# Patient Record
Sex: Female | Born: 1960 | Race: White | Hispanic: No | Marital: Married | Smoking: Never smoker
Health system: Southern US, Community
[De-identification: ages and names within clinical notes are randomized; demographics above are authoritative.]

## PROBLEM LIST (undated history)

## (undated) DIAGNOSIS — R7303 Prediabetes: Secondary | ICD-10-CM

## (undated) DIAGNOSIS — M1990 Unspecified osteoarthritis, unspecified site: Secondary | ICD-10-CM

## (undated) DIAGNOSIS — I8390 Asymptomatic varicose veins of unspecified lower extremity: Secondary | ICD-10-CM

## (undated) DIAGNOSIS — T7840XA Allergy, unspecified, initial encounter: Secondary | ICD-10-CM

## (undated) DIAGNOSIS — A419 Sepsis, unspecified organism: Secondary | ICD-10-CM

## (undated) DIAGNOSIS — Z8489 Family history of other specified conditions: Secondary | ICD-10-CM

## (undated) HISTORY — DX: Prediabetes: R73.03

## (undated) HISTORY — DX: Unspecified osteoarthritis, unspecified site: M19.90

## (undated) HISTORY — PX: EXTERNAL EAR SURGERY: SHX627

## (undated) HISTORY — PX: BREAST CYST ASPIRATION: SHX578

## (undated) HISTORY — DX: Asymptomatic varicose veins of unspecified lower extremity: I83.90

## (undated) HISTORY — DX: Allergy, unspecified, initial encounter: T78.40XA

---

## 2006-09-08 ENCOUNTER — Other Ambulatory Visit: Payer: Self-pay

## 2006-09-08 ENCOUNTER — Emergency Department: Payer: Self-pay | Admitting: Emergency Medicine

## 2010-09-27 ENCOUNTER — Ambulatory Visit: Payer: Self-pay | Admitting: Internal Medicine

## 2013-11-20 ENCOUNTER — Ambulatory Visit: Payer: Self-pay | Admitting: Family Medicine

## 2014-09-23 ENCOUNTER — Emergency Department: Payer: Self-pay | Admitting: Emergency Medicine

## 2014-09-23 LAB — CBC WITH DIFFERENTIAL/PLATELET
BASOS PCT: 0.5 %
Basophil #: 0 10*3/uL (ref 0.0–0.1)
Eosinophil #: 0.1 10*3/uL (ref 0.0–0.7)
Eosinophil %: 1.1 %
HCT: 41.9 % (ref 35.0–47.0)
HGB: 13.2 g/dL (ref 12.0–16.0)
Lymphocyte #: 1.5 10*3/uL (ref 1.0–3.6)
Lymphocyte %: 18.5 %
MCH: 28.3 pg (ref 26.0–34.0)
MCHC: 31.5 g/dL — ABNORMAL LOW (ref 32.0–36.0)
MCV: 90 fL (ref 80–100)
Monocyte #: 0.6 x10 3/mm (ref 0.2–0.9)
Monocyte %: 7.4 %
Neutrophil #: 5.7 10*3/uL (ref 1.4–6.5)
Neutrophil %: 72.5 %
Platelet: 264 10*3/uL (ref 150–440)
RBC: 4.66 10*6/uL (ref 3.80–5.20)
RDW: 13.7 % (ref 11.5–14.5)
WBC: 7.9 10*3/uL (ref 3.6–11.0)

## 2014-09-23 LAB — BASIC METABOLIC PANEL
Anion Gap: 6 — ABNORMAL LOW (ref 7–16)
BUN: 11 mg/dL (ref 7–18)
CALCIUM: 8.7 mg/dL (ref 8.5–10.1)
CO2: 30 mmol/L (ref 21–32)
CREATININE: 0.72 mg/dL (ref 0.60–1.30)
Chloride: 106 mmol/L (ref 98–107)
EGFR (Non-African Amer.): >60
Glucose: 85 mg/dL (ref 65–99)
Osmolality: 282 (ref 275–301)
POTASSIUM: 3.9 mmol/L (ref 3.5–5.1)
Sodium: 142 mmol/L (ref 136–145)

## 2014-09-23 LAB — TROPONIN I: Troponin-I: <0.02 ng/mL

## 2014-12-29 ENCOUNTER — Ambulatory Visit: Payer: Self-pay | Admitting: Family Medicine

## 2015-01-19 ENCOUNTER — Ambulatory Visit: Payer: Self-pay | Admitting: Gastroenterology

## 2015-11-30 ENCOUNTER — Telehealth: Payer: Self-pay | Admitting: Family Medicine

## 2015-11-30 NOTE — Telephone Encounter (Signed)
Patient has annual physical scheduled for next week and she would like to know if you would print her a lab order out (the general blood work). She would like to do blood work in the morning since her appointment is for the afternoon.

## 2015-12-01 ENCOUNTER — Other Ambulatory Visit: Payer: Self-pay | Admitting: Family Medicine

## 2015-12-01 DIAGNOSIS — I8390 Asymptomatic varicose veins of unspecified lower extremity: Secondary | ICD-10-CM | POA: Insufficient documentation

## 2015-12-01 DIAGNOSIS — J309 Allergic rhinitis, unspecified: Secondary | ICD-10-CM

## 2015-12-01 DIAGNOSIS — Z0000 Encounter for general adult medical examination without abnormal findings: Secondary | ICD-10-CM

## 2015-12-01 DIAGNOSIS — M129 Arthropathy, unspecified: Secondary | ICD-10-CM | POA: Insufficient documentation

## 2015-12-01 DIAGNOSIS — R7303 Prediabetes: Secondary | ICD-10-CM | POA: Insufficient documentation

## 2015-12-01 NOTE — Telephone Encounter (Signed)
Lab work ready to pick up and have drawn fasting.

## 2015-12-01 NOTE — Telephone Encounter (Signed)
Patient informed and will try to pick it up today. Thank you

## 2015-12-03 LAB — COMPREHENSIVE METABOLIC PANEL
ALBUMIN: 3.9 g/dL (ref 3.5–5.5)
ALK PHOS: 83 IU/L (ref 39–117)
ALT: 18 IU/L (ref 0–32)
AST: 25 IU/L (ref 0–40)
Albumin/Globulin Ratio: 1.4 (ref 1.1–2.5)
BUN / CREAT RATIO: 24 — AB (ref 9–23)
BUN: 18 mg/dL (ref 6–24)
Bilirubin Total: 0.5 mg/dL (ref 0.0–1.2)
CALCIUM: 9 mg/dL (ref 8.7–10.2)
CO2: 26 mmol/L (ref 18–29)
CREATININE: 0.76 mg/dL (ref 0.57–1.00)
Chloride: 100 mmol/L (ref 96–106)
GFR calc Af Amer: 104 mL/min/{1.73_m2} (ref >59)
GFR, EST NON AFRICAN AMERICAN: 90 mL/min/{1.73_m2} (ref >59)
GLOBULIN, TOTAL: 2.7 g/dL (ref 1.5–4.5)
GLUCOSE: 86 mg/dL (ref 65–99)
Potassium: 4.4 mmol/L (ref 3.5–5.2)
Sodium: 140 mmol/L (ref 134–144)
Total Protein: 6.6 g/dL (ref 6.0–8.5)

## 2015-12-03 LAB — CBC WITH DIFFERENTIAL/PLATELET
BASOS: 0 %
Basophils Absolute: 0 10*3/uL (ref 0.0–0.2)
EOS (ABSOLUTE): 0.2 10*3/uL (ref 0.0–0.4)
EOS: 3 %
HEMATOCRIT: 38.4 % (ref 34.0–46.6)
Hemoglobin: 12.5 g/dL (ref 11.1–15.9)
IMMATURE GRANULOCYTES: 0 %
Immature Grans (Abs): 0 10*3/uL (ref 0.0–0.1)
Lymphocytes Absolute: 2 10*3/uL (ref 0.7–3.1)
Lymphs: 29 %
MCH: 28.4 pg (ref 26.6–33.0)
MCHC: 32.6 g/dL (ref 31.5–35.7)
MCV: 87 fL (ref 79–97)
Monocytes Absolute: 0.7 10*3/uL (ref 0.1–0.9)
Monocytes: 11 %
NEUTROS PCT: 57 %
Neutrophils Absolute: 3.8 10*3/uL (ref 1.4–7.0)
Platelets: 283 10*3/uL (ref 150–379)
RBC: 4.4 x10E6/uL (ref 3.77–5.28)
RDW: 13.9 % (ref 12.3–15.4)
WBC: 6.7 10*3/uL (ref 3.4–10.8)

## 2015-12-03 LAB — LIPID PANEL
CHOL/HDL RATIO: 2 ratio (ref 0.0–4.4)
CHOLESTEROL TOTAL: 174 mg/dL (ref 100–199)
HDL: 89 mg/dL (ref >39)
LDL CALC: 73 mg/dL (ref 0–99)
TRIGLYCERIDES: 61 mg/dL (ref 0–149)
VLDL CHOLESTEROL CAL: 12 mg/dL (ref 5–40)

## 2015-12-03 LAB — HEMOGLOBIN A1C
Est. average glucose Bld gHb Est-mCnc: 123 mg/dL
Hgb A1c MFr Bld: 5.9 % — ABNORMAL HIGH (ref 4.8–5.6)

## 2015-12-03 LAB — TSH: TSH: 1.87 u[IU]/mL (ref 0.450–4.500)

## 2015-12-09 ENCOUNTER — Encounter: Payer: Self-pay | Admitting: Family Medicine

## 2015-12-09 ENCOUNTER — Ambulatory Visit (INDEPENDENT_AMBULATORY_CARE_PROVIDER_SITE_OTHER): Payer: Managed Care, Other (non HMO) | Admitting: Family Medicine

## 2015-12-09 VITALS — BP 112/68 | HR 72 | Temp 97.7°F | Resp 12 | Ht 60.0 in | Wt 124.0 lb

## 2015-12-09 DIAGNOSIS — Z0000 Encounter for general adult medical examination without abnormal findings: Secondary | ICD-10-CM | POA: Diagnosis not present

## 2015-12-09 DIAGNOSIS — Z1231 Encounter for screening mammogram for malignant neoplasm of breast: Secondary | ICD-10-CM | POA: Diagnosis not present

## 2015-12-09 DIAGNOSIS — B070 Plantar wart: Secondary | ICD-10-CM | POA: Diagnosis not present

## 2015-12-09 DIAGNOSIS — L989 Disorder of the skin and subcutaneous tissue, unspecified: Secondary | ICD-10-CM | POA: Diagnosis not present

## 2015-12-09 DIAGNOSIS — Z23 Encounter for immunization: Secondary | ICD-10-CM | POA: Insufficient documentation

## 2015-12-09 DIAGNOSIS — R7303 Prediabetes: Secondary | ICD-10-CM | POA: Diagnosis not present

## 2015-12-09 NOTE — Progress Notes (Signed)
Name: Jacqueline Rush   MRN: [id number]    DOB: [birth year]   Date:12/09/2015       Progress Note  Subjective  Chief Complaint  Chief Complaint  Patient presents with  . Annual Exam    HPI  Patient is here today for a Complete Female Physical Exam:  The patient has no unusual complaints but does note plantar warts and a irritative lesion on left anterior hip. Bleeds and irritates her under ware line. Overall feels healthy. Diet is well balanced. In general does exercise regularly. Sees dentist regularly and addresses vision concerns with ophthalmologist if applicable. In regards to sexual activity the patient is currently sexually active. Currently is not concerned about exposure to any STDs.    Past Medical History  Diagnosis Date  . Allergy   . Pre-diabetes   . Arthritis   . Varicose veins     Past Surgical History  Procedure Laterality Date  . Cesarean section      Family History  Problem Relation Age of Onset  . Cancer Father   . Diabetes Father   . Colon cancer Father   . Prostate cancer Father   . Ulcerative colitis Daughter   . Hypertension Mother   . Heart disease Mother     Social History   Social History  . Marital Status: Married    Spouse Name: N/A  . Number of Children: N/A  . Years of Education: N/A   Occupational History  . Not on file.   Social History Main Topics  . Smoking status: Never Smoker   . Smokeless tobacco: Not on file  . Alcohol Use: No  . Drug Use: No  . Sexual Activity:    Partners: Male   Other Topics Concern  . Not on file   Social History Narrative  . No narrative on file    No current outpatient prescriptions on file.  Allergies  Allergen Reactions  . Contrast Media [Iodinated Diagnostic Agents] Swelling  . Codeine Other (See Comments)    ROS  CONSTITUTIONAL: No significant weight changes, fever, chills, weakness or fatigue.  HEENT:  - Eyes: No visual changes.  - Ears: No auditory changes. No pain.  -  Nose: No sneezing, congestion, runny nose. - Throat: No sore throat. No changes in swallowing. SKIN: No rash or itching.  CARDIOVASCULAR: No chest pain, chest pressure or chest discomfort. No palpitations or edema.  RESPIRATORY: No shortness of breath, cough or sputum.  GASTROINTESTINAL: No anorexia, nausea, vomiting. No changes in bowel habits. No abdominal pain or blood.  GENITOURINARY: No dysuria. No frequency. No discharge.  NEUROLOGICAL: No headache, dizziness, syncope, paralysis, ataxia, numbness or tingling in the extremities. No memory changes. No change in bowel or bladder control.  MUSCULOSKELETAL: No joint pain. No muscle pain. HEMATOLOGIC: No anemia, bleeding or bruising.  LYMPHATICS: No enlarged lymph nodes.  PSYCHIATRIC: No change in mood. No change in sleep pattern.  ENDOCRINOLOGIC: No reports of sweating, cold or heat intolerance. No polyuria or polydipsia.   Objective  Filed Vitals:   12/09/15 1414  BP: 112/68  Pulse: 72  Temp: 97.7 F (36.5 C)  TempSrc: Oral  Resp: 12  Height: 5' (1.524 m)  Weight: 124 lb (56.246 kg)  SpO2: 95%   Body mass index is 24.22 kg/(m^2).    Recent Results (from the past 2160 hour(s))  CBC with Differential/Platelet     Status: None   Collection Time: 12/02/15  8:33 AM  Result Value Ref Range  WBC 6.7 3.4 - 10.8 x10E3/uL   RBC 4.40 3.77 - 5.28 x10E6/uL   Hemoglobin 12.5 11.1 - 15.9 g/dL   Hematocrit 38.4 34.0 - 46.6 %   MCV 87 79 - 97 fL   MCH 28.4 26.6 - 33.0 pg   MCHC 32.6 31.5 - 35.7 g/dL   RDW 13.9 12.3 - 15.4 %   Platelets 283 150 - 379 x10E3/uL   Neutrophils 57 %   Lymphs 29 %   Monocytes 11 %   Eos 3 %   Basos 0 %   Neutrophils Absolute 3.8 1.4 - 7.0 x10E3/uL   Lymphocytes Absolute 2.0 0.7 - 3.1 x10E3/uL   Monocytes Absolute 0.7 0.1 - 0.9 x10E3/uL   EOS (ABSOLUTE) 0.2 0.0 - 0.4 x10E3/uL   Basophils Absolute 0.0 0.0 - 0.2 x10E3/uL   Immature Granulocytes 0 %   Immature Grans (Abs) 0.0 0.0 - 0.1 x10E3/uL   Comprehensive metabolic panel     Status: Abnormal   Collection Time: 12/02/15  8:33 AM  Result Value Ref Range   Glucose 86 65 - 99 mg/dL   BUN 18 6 - 24 mg/dL   Creatinine, Ser 0.76 0.57 - 1.00 mg/dL   GFR calc non Af Amer 90 >59 mL/min/1.73   GFR calc Af Amer 104 >59 mL/min/1.73   BUN/Creatinine Ratio 24 (H) 9 - 23   Sodium 140 134 - 144 mmol/L    Comment:               **Please note reference interval change**   Potassium 4.4 3.5 - 5.2 mmol/L   Chloride 100 96 - 106 mmol/L    Comment:               **Please note reference interval change**   CO2 26 18 - 29 mmol/L   Calcium 9.0 8.7 - 10.2 mg/dL   Total Protein 6.6 6.0 - 8.5 g/dL   Albumin 3.9 3.5 - 5.5 g/dL   Globulin, Total 2.7 1.5 - 4.5 g/dL   Albumin/Globulin Ratio 1.4 1.1 - 2.5   Bilirubin Total 0.5 0.0 - 1.2 mg/dL   Alkaline Phosphatase 83 39 - 117 IU/L   AST 25 0 - 40 IU/L   ALT 18 0 - 32 IU/L  Lipid panel     Status: None   Collection Time: 12/02/15  8:33 AM  Result Value Ref Range   Cholesterol, Total 174 100 - 199 mg/dL   Triglycerides 61 0 - 149 mg/dL   HDL 89 >39 mg/dL   VLDL Cholesterol Cal 12 5 - 40 mg/dL   LDL Calculated 73 0 - 99 mg/dL   Chol/HDL Ratio 2.0 0.0 - 4.4 ratio units    Comment:                                   T. Chol/HDL Ratio                                             Men  Women                               1/2 Avg.Risk  3.4    3.3  Avg.Risk  5.0    4.4                                2X Avg.Risk  9.6    7.1                                3X Avg.Risk 23.4   11.0   TSH     Status: None   Collection Time: 12/02/15  8:33 AM  Result Value Ref Range   TSH 1.870 0.450 - 4.500 uIU/mL  Hemoglobin A1c     Status: Abnormal   Collection Time: 12/02/15  8:33 AM  Result Value Ref Range   Hgb A1c MFr Bld 5.9 (H) 4.8 - 5.6 %    Comment:          Pre-diabetes: 5.7 - 6.4          Diabetes: >6.4          Glycemic control for adults with diabetes: <7.0    Est.  average glucose Bld gHb Est-mCnc 123 mg/dL    Physical Exam  Constitutional: Patient appears well-developed and well-nourished. In no distress.  HEENT:  - Head: Normocephalic and atraumatic.  - Ears: Bilateral TMs gray, no erythema or effusion - Nose: Nasal mucosa moist - Mouth/Throat: Oropharynx is clear and moist. No tonsillar hypertrophy or erythema. No post nasal drainage.  - Eyes: Conjunctivae clear, EOM movements normal. PERRLA. No scleral icterus.  Neck: Normal range of motion. Neck supple. No JVD present. No thyromegaly present.  Cardiovascular: Normal rate, regular rhythm and normal heart sounds.  No murmur heard.  Pulmonary/Chest: Effort normal and breath sounds normal. No respiratory distress. Abdominal: Soft. Bowel sounds are normal, no distension. There is no tenderness. no masses BREAST: Bilateral breast exam normal with no masses, skin changes or nipple discharge FEMALE GENITALIA: Deferred Musculoskeletal: Normal range of motion bilateral UE and LE, no joint effusions. Peripheral vascular: Bilateral LE no edema. Spider veins and varicose veins blt.  Neurological: CN II-XII grossly intact with no focal deficits. Alert and oriented to person, place, and time. Coordination, balance, strength, speech and gait are normal.  Skin: Skin is warm and dry. No rash noted. No erythema. 2 CM pedunculated skin lesion left anterior hip area. Scattered nevi.  Psychiatric: Patient has a normal mood and affect. Behavior is normal in office today. Judgment and thought content normal in office today.   Assessment & Plan  1. Annual physical exam Discussed in detail all recommended preventative measures appropriate for age and gender now and in the future.   2. Encounter for screening mammogram for malignant neoplasm of breast  - MM Digital Screening; Future  3. Prediabetes Reviewed all lab work.  4. Skin lesion Derm for removal  5. Plantar warts Monitor  6. Need for immunization  against influenza  - Flu vaccine > 3yo with preservative IM (Fluvirin Influenza Split)

## 2016-01-04 ENCOUNTER — Ambulatory Visit
Admission: RE | Admit: 2016-01-04 | Discharge: 2016-01-04 | Disposition: A | Discharge status: Home / Self Care | Payer: Managed Care, Other (non HMO) | Source: Ambulatory Visit | Attending: Family Medicine | Admitting: Family Medicine

## 2016-01-04 DIAGNOSIS — Z1231 Encounter for screening mammogram for malignant neoplasm of breast: Secondary | ICD-10-CM

## 2016-03-17 ENCOUNTER — Emergency Department
Admission: EM | Admit: 2016-03-17 | Discharge: 2016-03-17 | Disposition: A | Discharge status: Home / Self Care | Payer: Managed Care, Other (non HMO) | Attending: Emergency Medicine | Admitting: Emergency Medicine

## 2016-03-17 DIAGNOSIS — Y929 Unspecified place or not applicable: Secondary | ICD-10-CM | POA: Diagnosis not present

## 2016-03-17 DIAGNOSIS — Z86718 Personal history of other venous thrombosis and embolism: Secondary | ICD-10-CM | POA: Diagnosis not present

## 2016-03-17 DIAGNOSIS — M1990 Unspecified osteoarthritis, unspecified site: Secondary | ICD-10-CM | POA: Insufficient documentation

## 2016-03-17 DIAGNOSIS — Y999 Unspecified external cause status: Secondary | ICD-10-CM | POA: Insufficient documentation

## 2016-03-17 DIAGNOSIS — Y9389 Activity, other specified: Secondary | ICD-10-CM | POA: Insufficient documentation

## 2016-03-17 DIAGNOSIS — W228XXA Striking against or struck by other objects, initial encounter: Secondary | ICD-10-CM | POA: Diagnosis not present

## 2016-03-17 DIAGNOSIS — S81811A Laceration without foreign body, right lower leg, initial encounter: Secondary | ICD-10-CM

## 2016-03-17 MED ORDER — LIDOCAINE-EPINEPHRINE (PF) 1 %-1:200000 IJ SOLN
10.0000 mL | Freq: Once | INTRAMUSCULAR | Status: AC
  Administered 2016-03-17: 10 mL
  Filled 2016-03-17: qty 30

## 2016-03-17 NOTE — Discharge Instructions (Signed)
Laceration Care, Adult °A laceration is a cut that goes through all of the layers of the skin and into the tissue that is right under the skin. Some lacerations heal on their own. Others need to be closed with stitches (sutures), staples, skin adhesive strips, or skin glue. Proper laceration care minimizes the risk of infection and helps the laceration to heal better. °HOW TO CARE FOR YOUR LACERATION °If sutures or staples were used: °· Keep the wound clean and dry. °· If you were given a bandage (dressing), you should change it at least one time per day or as told by your health care provider. You should also change it if it becomes wet or dirty. °· Keep the wound completely dry for the first 24 hours or as told by your health care provider. After that time, you may shower or bathe. However, make sure that the wound is not soaked in water until after the sutures or staples have been removed. °· Clean the wound one time each day or as told by your health care provider: °· Wash the wound with soap and water. °· Rinse the wound with water to remove all soap. °· Pat the wound dry with a clean towel. Do not rub the wound. °· After cleaning the wound, apply a thin layer of antibiotic ointment as told by your health care provider. This will help to prevent infection and keep the dressing from sticking to the wound. °· Have the sutures or staples removed as told by your health care provider. °If skin adhesive strips were used: °· Keep the wound clean and dry. °· If you were given a bandage (dressing), you should change it at least one time per day or as told by your health care provider. You should also change it if it becomes dirty or wet. °· Do not get the skin adhesive strips wet. You may shower or bathe, but be careful to keep the wound dry. °· If the wound gets wet, pat it dry with a clean towel. Do not rub the wound. °· Skin adhesive strips fall off on their own. You may trim the strips as the wound heals. Do not  remove skin adhesive strips that are still stuck to the wound. They will fall off in time. °If skin glue was used: °· Try to keep the wound dry, but you may briefly wet it in the shower or bath. Do not soak the wound in water, such as by swimming. °· After you have showered or bathed, gently pat the wound dry with a clean towel. Do not rub the wound. °· Do not do any activities that will make you sweat heavily until the skin glue has fallen off on its own. °· Do not apply liquid, cream, or ointment medicine to the wound while the skin glue is in place. Using those may loosen the film before the wound has healed. °· If you were given a bandage (dressing), you should change it at least one time per day or as told by your health care provider. You should also change it if it becomes dirty or wet. °· If a dressing is placed over the wound, be careful not to apply tape directly over the skin glue. Doing that may cause the glue to be pulled off before the wound has healed. °· Do not pick at the glue. The skin glue usually remains in place for 5-10 days, then it falls off of the skin. °General Instructions °· Take over-the-counter and prescription   medicines only as told by your health care provider. °· If you were prescribed an antibiotic medicine or ointment, take or apply it as told by your doctor. Do not stop using it even if your condition improves. °· To help prevent scarring, make sure to cover your wound with sunscreen whenever you are outside after stitches are removed, after adhesive strips are removed, or when glue remains in place and the wound is healed. Make sure to wear a sunscreen of at least 30 SPF. °· Do not scratch or pick at the wound. °· Keep all follow-up visits as told by your health care provider. This is important. °· Check your wound every day for signs of infection. Watch for: °· Redness, swelling, or pain. °· Fluid, blood, or pus. °· Raise (elevate) the injured area above the level of your heart  while you are sitting or lying down, if possible. °SEEK MEDICAL CARE IF: °· You received a tetanus shot and you have swelling, severe pain, redness, or bleeding at the injection site. °· You have a fever. °· A wound that was closed breaks open. °· You notice a bad smell coming from your wound or your dressing. °· You notice something coming out of the wound, such as wood or glass. °· Your pain is not controlled with medicine. °· You have increased redness, swelling, or pain at the site of your wound. °· You have fluid, blood, or pus coming from your wound. °· You notice a change in the color of your skin near your wound. °· You need to change the dressing frequently due to fluid, blood, or pus draining from the wound. °· You develop a new rash. °· You develop numbness around the wound. °SEEK IMMEDIATE MEDICAL CARE IF: °· You develop severe swelling around the wound. °· Your pain suddenly increases and is severe. °· You develop painful lumps near the wound or on skin that is anywhere on your body. °· You have a red streak going away from your wound. °· The wound is on your hand or foot and you cannot properly move a finger or toe. °· The wound is on your hand or foot and you notice that your fingers or toes look pale or bluish. °  °This information is not intended to replace advice given to you by your health care provider. Make sure you discuss any questions you have with your health care provider. °  °Document Released: 11/28/2005 Document Revised: 04/14/2015 Document Reviewed: 11/24/2014 °[employer]vier Interactive Patient Education ©2016 [employer] ° °Stitches, Staples, or Adhesive Wound Closure °Health care providers use stitches (sutures), staples, and certain glue (skin adhesives) to hold skin together while it heals (wound closure). You may need this treatment after you have surgery or if you cut your skin accidentally. These methods help your skin to heal more quickly and make it less likely that you will have  a scar. A wound may take several months to heal completely. °The type of wound you have determines when your wound gets closed. In most cases, the wound is closed as soon as possible (primary skin closure). Sometimes, closure is delayed so the wound can be cleaned and allowed to heal naturally. This reduces the chance of infection. Delayed closure may be needed if your wound: °· Is caused by a bite. °· Happened more than 6 hours ago. °· Involves loss of skin or the tissues under the skin. °· Has dirt or debris in it that cannot be removed. °· Is infected. °WHAT   ARE THE DIFFERENT KINDS OF WOUND CLOSURES? °There are many options for wound closure. The one that your health care provider uses depends on how deep and how large your wound is. °Adhesive Glue °To use this type of glue to close a wound, your health care provider holds the edges of the wound together and paints the glue on the surface of your skin. You may need more than one layer of glue. Then the wound may be covered with a light bandage (dressing). °This type of skin closure may be used for small wounds that are not deep (superficial). Using glue for wound closure is less painful than other methods. It does not require a medicine that numbs the area (local anesthetic). This method also leaves nothing to be removed. Adhesive glue is often used for children and on facial wounds. °Adhesive glue cannot be used for wounds that are deep, uneven, or bleeding. It is not used inside of a wound.  °Adhesive Strips °These strips are made of sticky (adhesive), porous paper. They are applied across your skin edges like a regular adhesive bandage. You leave them on until they fall off. °Adhesive strips may be used to close very superficial wounds. They may also be used along with sutures to improve the closure of your skin edges.  °Sutures °Sutures are the oldest method of wound closure. Sutures can be made from natural substances, such as silk, or from synthetic  materials, such as nylon and steel. They can be made from a material that your body can break down as your wound heals (absorbable), or they can be made from a material that needs to be removed from your skin (nonabsorbable). They come in many different strengths and sizes. °Your health care provider attaches the sutures to a steel needle on one end. Sutures can be passed through your skin, or through the tissues beneath your skin. Then they are tied and cut. Your skin edges may be closed in one continuous stitch or in separate stitches. °Sutures are strong and can be used for all kinds of wounds. Absorbable sutures may be used to close tissues under the skin. The disadvantage of sutures is that they may cause skin reactions that lead to infection. Nonabsorbable sutures need to be removed. °Staples °When surgical staples are used to close a wound, the edges of your skin on both sides of the wound are brought close together. A staple is placed across the wound, and an instrument secures the edges together. Staples are often used to close surgical cuts (incisions). °Staples are faster to use than sutures, and they cause less skin reaction. Staples need to be removed using a tool that bends the staples away from your skin. °HOW DO I CARE FOR MY WOUND CLOSURE? °· Take medicines only as directed by your health care provider. °· If you were prescribed an antibiotic medicine for your wound, finish it all even if you start to feel better. °· Use ointments or creams only as directed by your health care provider. °· Wash your hands with soap and water before and after touching your wound. °· Do not soak your wound in water. Do not take baths, swim, or use a hot tub until your health care provider approves. °· Ask your health care provider when you can start showering. Cover your wound if directed by your health care provider. °· Do not take out your own sutures or staples. °· Do not pick at your wound. Picking can cause an  infection. °·   Keep all follow-up visits as directed by your health care provider. This is important. °HOW LONG WILL I HAVE MY WOUND CLOSURE? °· Leave adhesive glue on your skin until the glue peels away. °· Leave adhesive strips on your skin until the strips fall off. °· Absorbable sutures will dissolve within several days. °· Nonabsorbable sutures and staples must be removed. The location of the wound will determine how long they stay in. This can range from several days to a couple of weeks. °WHEN SHOULD I SEEK HELP FOR MY WOUND CLOSURE? °Contact your health care provider if: °· You have a fever. °· You have chills. °· You have drainage, redness, swelling, or pain at your wound. °· There is a bad smell coming from your wound. °· The skin edges of your wound start to separate after your sutures have been removed. °· Your wound becomes thick, raised, and darker in color after your sutures come out (scarring). °  °This information is not intended to replace advice given to you by your health care provider. Make sure you discuss any questions you have with your health care provider. °  °Document Released: 08/23/2001 Document Revised: 12/19/2014 Document Reviewed: 05/07/2014 °Elsevier Interactive Patient Education ©2016 [employer] ° °

## 2016-03-17 NOTE — ED Provider Notes (Signed)
[facility] Emergency Department Provider Note  ____________________________________________  Time seen: Approximately 8:16 PM  I have reviewed the triage vital signs and the nursing notes.   HISTORY  Chief Complaint Laceration    HPI Jacqueline Rush is a 55 y.o. female who presents emergency department complaining of laceration to her right lower leg. Patient was using box jumps when she missed the box hitting her shin. Patient is endorsing a laceration to the anterior aspect of her shin. Bleeding is controlled prior to arrival. Patient is up-to-date on tetanus. Pain is minimal at this time.   Past Medical History  Diagnosis Date  . Allergy   . Pre-diabetes   . Arthritis   . Varicose veins     Patient Active Problem List   Diagnosis Date Noted  . Annual physical exam 12/09/2015  . Encounter for screening mammogram for malignant neoplasm of breast 12/09/2015  . Skin lesion 12/09/2015  . Plantar warts 12/09/2015  . Need for immunization against influenza 12/09/2015  . Arthritis, multiple joint involvement 12/01/2015  . Varicose vein 12/01/2015  . Allergic rhinitis 12/01/2015  . Prediabetes 12/01/2015    Past Surgical History  Procedure Laterality Date  . Cesarean section    . Breast cyst aspiration Left     No current outpatient prescriptions on file.  Allergies Contrast media and Codeine  Family History  Problem Relation Age of Onset  . Cancer Father   . Diabetes Father   . Colon cancer Father   . Prostate cancer Father   . Ulcerative colitis Daughter   . Hypertension Mother   . Heart disease Mother     Social History Social History  Substance Use Topics  . Smoking status: Never Smoker   . Smokeless tobacco: Not on file  . Alcohol Use: No     Review of Systems  Constitutional: No fever/chills Cardiovascular: no chest pain. Respiratory: no cough. No SOB. Musculoskeletal: Negative for back pain. Skin: Negative for  rash.Positive for laceration to her right lower leg Neurological: Negative for headaches, focal weakness or numbness. 10-point ROS otherwise negative.  ____________________________________________   PHYSICAL EXAM:  VITAL SIGNS: ED Triage Vitals  Enc Vitals Group     BP 03/17/16 1947 160/84 mmHg     Pulse Rate 03/17/16 1947 73     Resp 03/17/16 1947 18     Temp 03/17/16 1947 98.1 F (36.7 C)     Temp Source 03/17/16 1947 Oral     SpO2 03/17/16 1947 98 %     Weight 03/17/16 1947 117 lb (53.071 kg)     Height 03/17/16 1947 5' (1.524 m)     Head Cir --      Peak Flow --      Pain Score --      Pain Loc --      Pain Edu? --      Excl. in [place]? --      Constitutional: Alert and oriented. Well appearing and in no acute distress. Cardiovascular: Normal rate, regular rhythm. Normal S1 and S2.  Good peripheral circulation. Respiratory: Normal respiratory effort without tachypnea or retractions. Lungs CTAB. Musculoskeletal: Full range of motion to right lower extremity. Exam of the knee and ankle are unremarkable. Neurologic:  Normal speech and language. No gross focal neurologic deficits are appreciated.  Skin:  Skin is warm, dry and intact. No rash noted. Laceration noted to right anterior lower leg. Laceration is deep with exposed subcutaneous and muscle tissue. Laceration has not  cut muscle tissue. Laceration is approximately 7 cm in length. Edges are ragged. Small foreign body is noted on the lateral aspect of the wound. Bleeding is controlled. Psychiatric: Mood and affect are normal. Speech and behavior are normal. Patient exhibits appropriate insight and judgement.   ____________________________________________   LABS (all labs ordered are listed, but only abnormal results are displayed)  Labs Reviewed - No data to display ____________________________________________  EKG   ____________________________________________  RADIOLOGY   No results  found.  ____________________________________________    PROCEDURES  Procedure(s) performed:   LACERATION REPAIR Performed by: Darletta Moll Authorized by: Charline Bills Cuthriell Consent: Verbal consent obtained. Risks and benefits: risks, benefits and alternatives were discussed Consent given by: patient Patient identity confirmed: provided demographic data Prepped and Draped in normal sterile fashion Wound explored  Laceration Location: Right anterior lower leg  Laceration Length: 7 cm  Foreign body is visualized on the lateral aspect of the wound. This is consistent with a small chip of wood.  Anesthesia: local infiltration  Local anesthetic: lidocaine 1 % with epinephrine  Anesthetic total: 10 ml  Irrigation method: syringe Amount of cleaning: Extensive   Skin closure: 3-0 Ethilon sutures and 5-0 Vicryl sutures   Number of sutures: 3 subcutaneous sutures are placed using 5-0 Vicryl suture material. 8 sutures are placed using 3-0 Ethilon sutures to close the exterior of the wound.   Technique: 3 subcutaneous sutures were placed using 5-0 Vicryl. 8 sutures are placed using 3-0 Ethilon suture material. Edges are well approximated. Previous to this wound was thoroughly irrigated and visible foreign body was removed.   Patient tolerance: Patient tolerated the procedure well with no immediate complications.     Medications  lidocaine-EPINEPHrine (XYLOCAINE-EPINEPHrine) 1 %-1:200000 (PF) injection 10 mL (10 mLs Infiltration Given 03/17/16 2029)     ____________________________________________   INITIAL IMPRESSION / ASSESSMENT AND PLAN / ED COURSE  Pertinent labs & imaging results that were available during my care of the patient were reviewed by me and considered in my medical decision making (see chart for details).  Patient's diagnosis is consistent with a laceration to the right leg. This is close described above. Patient was current on her tetanus and no  tetanus booster is provided today.. Patient and take Tylenol and/or Motrin at home for symptom relief. Patient is to follow up with primary care provider in one week for suture removal or sooner if needed. Patient is given ED precautions to return to the ED for any worsening or new symptoms.     ____________________________________________  FINAL CLINICAL IMPRESSION(S) / ED DIAGNOSES  Final diagnoses:  Leg laceration, right, initial encounter      NEW MEDICATIONS STARTED DURING THIS VISIT:  New Prescriptions   No medications on file        This chart was dictated using voice recognition software/Dragon. Despite best efforts to proofread, errors can occur which can change the meaning. Any change was purely unintentional.    Darletta Moll, PA-C 03/17/16 [place], MD 03/17/16 [phone]

## 2016-03-17 NOTE — ED Notes (Signed)
Pt in with lac to right shin was doing box jumps and missed the box hitting shin on the box.

## 2016-04-21 ENCOUNTER — Other Ambulatory Visit: Payer: Self-pay | Admitting: Student

## 2016-04-21 DIAGNOSIS — M7582 Other shoulder lesions, left shoulder: Secondary | ICD-10-CM

## 2016-05-11 ENCOUNTER — Ambulatory Visit
Admission: RE | Admit: 2016-05-11 | Discharge: 2016-05-11 | Disposition: A | Discharge status: Home / Self Care | Payer: Managed Care, Other (non HMO) | Source: Ambulatory Visit | Attending: Student | Admitting: Student

## 2016-05-11 DIAGNOSIS — S43402A Unspecified sprain of left shoulder joint, initial encounter: Secondary | ICD-10-CM | POA: Diagnosis not present

## 2016-05-11 DIAGNOSIS — M25712 Osteophyte, left shoulder: Secondary | ICD-10-CM | POA: Diagnosis not present

## 2016-05-11 DIAGNOSIS — M7582 Other shoulder lesions, left shoulder: Secondary | ICD-10-CM

## 2016-05-11 MED ORDER — IOPAMIDOL (ISOVUE-300) INJECTION 61%: 5.0000 mL | Freq: Once | INTRAVENOUS | Status: DC | PRN

## 2016-05-11 MED ORDER — GADOBENATE DIMEGLUMINE 529 MG/ML IV SOLN: 0.1000 mL | Freq: Once | INTRAVENOUS | Status: DC | PRN

## 2016-05-11 MED ORDER — LIDOCAINE HCL (PF) 1 % IJ SOLN
5.0000 mL | Freq: Once | INTRAMUSCULAR | Status: DC
  Filled 2016-05-11: qty 5

## 2016-05-16 DIAGNOSIS — M7582 Other shoulder lesions, left shoulder: Secondary | ICD-10-CM | POA: Insufficient documentation

## 2016-05-16 DIAGNOSIS — S83231A Complex tear of medial meniscus, current injury, right knee, initial encounter: Secondary | ICD-10-CM | POA: Insufficient documentation

## 2016-05-16 DIAGNOSIS — S43432A Superior glenoid labrum lesion of left shoulder, initial encounter: Secondary | ICD-10-CM | POA: Insufficient documentation

## 2016-05-18 ENCOUNTER — Other Ambulatory Visit: Payer: Self-pay | Admitting: Surgery

## 2016-05-18 DIAGNOSIS — S83231A Complex tear of medial meniscus, current injury, right knee, initial encounter: Secondary | ICD-10-CM

## 2016-05-23 ENCOUNTER — Ambulatory Visit
Admission: RE | Admit: 2016-05-23 | Discharge: 2016-05-23 | Disposition: A | Discharge status: Home / Self Care | Payer: Managed Care, Other (non HMO) | Source: Ambulatory Visit | Attending: Surgery | Admitting: Surgery

## 2016-05-23 DIAGNOSIS — S83231A Complex tear of medial meniscus, current injury, right knee, initial encounter: Secondary | ICD-10-CM | POA: Diagnosis not present

## 2016-05-23 DIAGNOSIS — M25461 Effusion, right knee: Secondary | ICD-10-CM | POA: Insufficient documentation

## 2016-05-23 DIAGNOSIS — S83411A Sprain of medial collateral ligament of right knee, initial encounter: Secondary | ICD-10-CM | POA: Diagnosis not present

## 2016-05-23 DIAGNOSIS — M948X6 Other specified disorders of cartilage, lower leg: Secondary | ICD-10-CM | POA: Insufficient documentation

## 2016-05-27 DIAGNOSIS — M1711 Unilateral primary osteoarthritis, right knee: Secondary | ICD-10-CM | POA: Insufficient documentation

## 2016-05-30 ENCOUNTER — Encounter: Payer: Self-pay | Admitting: *Deleted

## 2016-06-01 ENCOUNTER — Encounter
Admission: RE | Disposition: A | Discharge status: Home / Self Care | Payer: Self-pay | Source: Ambulatory Visit | Attending: Surgery

## 2016-06-01 ENCOUNTER — Ambulatory Visit
Admission: RE | Admit: 2016-06-01 | Discharge: 2016-06-01 | Disposition: A | Discharge status: Home / Self Care | Payer: Managed Care, Other (non HMO) | Source: Ambulatory Visit | Attending: Surgery | Admitting: Surgery

## 2016-06-01 ENCOUNTER — Ambulatory Visit: Payer: Managed Care, Other (non HMO) | Admitting: Anesthesiology

## 2016-06-01 DIAGNOSIS — M1711 Unilateral primary osteoarthritis, right knee: Secondary | ICD-10-CM | POA: Insufficient documentation

## 2016-06-01 DIAGNOSIS — M1990 Unspecified osteoarthritis, unspecified site: Secondary | ICD-10-CM | POA: Insufficient documentation

## 2016-06-01 DIAGNOSIS — M6751 Plica syndrome, right knee: Secondary | ICD-10-CM | POA: Insufficient documentation

## 2016-06-01 DIAGNOSIS — S83231A Complex tear of medial meniscus, current injury, right knee, initial encounter: Secondary | ICD-10-CM | POA: Insufficient documentation

## 2016-06-01 DIAGNOSIS — R7303 Prediabetes: Secondary | ICD-10-CM | POA: Insufficient documentation

## 2016-06-01 HISTORY — DX: Family history of other specified conditions: Z84.89

## 2016-06-01 HISTORY — PX: KNEE ARTHROSCOPY WITH MEDIAL MENISECTOMY: SHX5651

## 2016-06-01 SURGERY — ARTHROSCOPY, KNEE, WITH MEDIAL MENISCECTOMY
Anesthesia: General | Laterality: Right | Wound class: Clean

## 2016-06-01 MED ORDER — DEXAMETHASONE SODIUM PHOSPHATE 4 MG/ML IJ SOLN
INTRAMUSCULAR | Status: DC | PRN
  Administered 2016-06-01: 8 mg via INTRAVENOUS

## 2016-06-01 MED ORDER — ONDANSETRON HCL 4 MG/2ML IJ SOLN
INTRAMUSCULAR | Status: DC | PRN
  Administered 2016-06-01: 4 mg via INTRAVENOUS

## 2016-06-01 MED ORDER — LIDOCAINE HCL (CARDIAC) 20 MG/ML IV SOLN
INTRAVENOUS | Status: DC | PRN
  Administered 2016-06-01: 30 mg via INTRATRACHEAL

## 2016-06-01 MED ORDER — ONDANSETRON HCL 4 MG/2ML IJ SOLN: 4.0000 mg | Freq: Four times a day (QID) | INTRAMUSCULAR | Status: DC | PRN

## 2016-06-01 MED ORDER — OXYCODONE HCL 5 MG PO TABS: 5.0000 mg | ORAL_TABLET | Freq: Once | ORAL | Status: DC | PRN

## 2016-06-01 MED ORDER — ONDANSETRON HCL 4 MG PO TABS: 4.0000 mg | ORAL_TABLET | Freq: Four times a day (QID) | ORAL | Status: DC | PRN

## 2016-06-01 MED ORDER — BUPIVACAINE-EPINEPHRINE (PF) 0.5% -1:200000 IJ SOLN
INTRAMUSCULAR | Status: DC | PRN
  Administered 2016-06-01: 20 mL

## 2016-06-01 MED ORDER — OXYCODONE HCL 5 MG/5ML PO SOLN: 5.0000 mg | Freq: Once | ORAL | Status: DC | PRN

## 2016-06-01 MED ORDER — LACTATED RINGERS IV SOLN
INTRAVENOUS | Status: DC
  Administered 2016-06-01: 13:00:00 via INTRAVENOUS

## 2016-06-01 MED ORDER — METOCLOPRAMIDE HCL 5 MG PO TABS: 5.0000 mg | ORAL_TABLET | Freq: Three times a day (TID) | ORAL | Status: DC | PRN

## 2016-06-01 MED ORDER — PROMETHAZINE HCL 25 MG/ML IJ SOLN: 6.2500 mg | INTRAMUSCULAR | Status: DC | PRN

## 2016-06-01 MED ORDER — PROPOFOL 10 MG/ML IV BOLUS
INTRAVENOUS | Status: DC | PRN
  Administered 2016-06-01: 100 mg via INTRAVENOUS

## 2016-06-01 MED ORDER — FENTANYL CITRATE (PF) 100 MCG/2ML IJ SOLN
INTRAMUSCULAR | Status: DC | PRN
  Administered 2016-06-01: 100 ug via INTRAVENOUS

## 2016-06-01 MED ORDER — HYDROMORPHONE HCL 1 MG/ML IJ SOLN: 0.2500 mg | INTRAMUSCULAR | Status: DC | PRN

## 2016-06-01 MED ORDER — CEFAZOLIN IN D5W 1 GM/50ML IV SOLN
1.0000 g | Freq: Once | INTRAVENOUS | Status: AC
  Administered 2016-06-01: 1 g via INTRAVENOUS

## 2016-06-01 MED ORDER — MIDAZOLAM HCL 5 MG/5ML IJ SOLN
INTRAMUSCULAR | Status: DC | PRN
  Administered 2016-06-01: 2 mg via INTRAVENOUS

## 2016-06-01 MED ORDER — LIDOCAINE HCL 1 % IJ SOLN
INTRAMUSCULAR | Status: DC | PRN
  Administered 2016-06-01: 60 mL via INTRAMUSCULAR

## 2016-06-01 MED ORDER — HYDROCODONE-ACETAMINOPHEN 5-325 MG PO TABS: 1.0000 | ORAL_TABLET | Freq: Four times a day (QID) | ORAL | Status: DC | PRN

## 2016-06-01 MED ORDER — POTASSIUM CHLORIDE IN NACL 20-0.9 MEQ/L-% IV SOLN: INTRAVENOUS | Status: DC

## 2016-06-01 MED ORDER — METOCLOPRAMIDE HCL 5 MG/ML IJ SOLN: 5.0000 mg | Freq: Three times a day (TID) | INTRAMUSCULAR | Status: DC | PRN

## 2016-06-01 MED ORDER — MEPERIDINE HCL 25 MG/ML IJ SOLN: 6.2500 mg | INTRAMUSCULAR | Status: DC | PRN

## 2016-06-01 SURGICAL SUPPLY — 30 items
BANDAGE ELASTIC 6 VELCRO NS (GAUZE/BANDAGES/DRESSINGS) ×2 IMPLANT
BLADE FULL RADIUS 3.5 (BLADE) ×2 IMPLANT
BUR ACROMIONIZER 4.0 (BURR) IMPLANT
CHLORAPREP W/TINT 26ML (MISCELLANEOUS) ×2 IMPLANT
COVER LIGHT HANDLE UNIVERSAL (MISCELLANEOUS) ×4 IMPLANT
CUFF TOURN SGL QUICK 30 (MISCELLANEOUS) ×1
CUFF TOURN SGL QUICK 34 (TOURNIQUET CUFF)
CUFF TRNQT CYL 34X4X40X1 (TOURNIQUET CUFF) IMPLANT
CUFF TRNQT CYL LO 30X4X (MISCELLANEOUS) ×1 IMPLANT
DRAPE IMP U-DRAPE 54X76 (DRAPES) ×2 IMPLANT
GAUZE SPONGE 4X4 12PLY STRL (GAUZE/BANDAGES/DRESSINGS) ×2 IMPLANT
GLOVE BIO SURGEON STRL SZ8 (GLOVE) ×4 IMPLANT
GLOVE INDICATOR 8.0 STRL GRN (GLOVE) ×2 IMPLANT
GOWN STRL REUS W/ TWL LRG LVL3 (GOWN DISPOSABLE) ×1 IMPLANT
GOWN STRL REUS W/ TWL XL LVL3 (GOWN DISPOSABLE) ×1 IMPLANT
GOWN STRL REUS W/TWL LRG LVL3 (GOWN DISPOSABLE) ×1
GOWN STRL REUS W/TWL XL LVL3 (GOWN DISPOSABLE) ×1
IV LACTATED RINGER IRRG 3000ML (IV SOLUTION) ×2
IV LR IRRIG 3000ML ARTHROMATIC (IV SOLUTION) ×2 IMPLANT
KIT ROOM TURNOVER OR (KITS) ×2 IMPLANT
MANIFOLD 4PT FOR NEPTUNE1 (MISCELLANEOUS) ×2 IMPLANT
NEEDLE HYPO 21X1.5 SAFETY (NEEDLE) ×4 IMPLANT
PACK ARTHROSCOPY KNEE (MISCELLANEOUS) ×2 IMPLANT
STRAP BODY AND KNEE 60X3 (MISCELLANEOUS) ×2 IMPLANT
SUT PROLENE 4 0 PS 2 18 (SUTURE) ×2 IMPLANT
SUT VIC AB 2-0 CT1 27 (SUTURE)
SUT VIC AB 2-0 CT1 TAPERPNT 27 (SUTURE) IMPLANT
SYR 50ML LL SCALE MARK (SYRINGE) ×2 IMPLANT
TUBING ARTHRO INFLOW-ONLY STRL (TUBING) ×2 IMPLANT
WAND HAND CNTRL MULTIVAC 90 (MISCELLANEOUS) IMPLANT

## 2016-06-01 NOTE — Discharge Instructions (Signed)
General Anesthesia, Adult, Care After Refer to this sheet in the next few weeks. These instructions provide you with information on caring for yourself after your procedure. Your health care provider may also give you more specific instructions. Your treatment has been planned according to current medical practices, but problems sometimes occur. Call your health care provider if you have any problems or questions after your procedure. WHAT TO EXPECT AFTER THE PROCEDURE After the procedure, it is typical to experience:  Sleepiness.  Nausea and vomiting. HOME CARE INSTRUCTIONS  For the first 24 hours after general anesthesia:  Have a responsible person with you.  Do not drive a car. If you are alone, do not take public transportation.  Do not drink alcohol.  Do not take medicine that has not been prescribed by your health care provider.  Do not sign important papers or make important decisions.  You may resume a normal diet and activities as directed by your health care provider.  Change bandages (dressings) as directed.  If you have questions or problems that seem related to general anesthesia, call the hospital and ask for the anesthetist or anesthesiologist on call. SEEK MEDICAL CARE IF:  You have nausea and vomiting that continue the day after anesthesia.  You develop a rash. SEEK IMMEDIATE MEDICAL CARE IF:   You have difficulty breathing.  You have chest pain.  You have any allergic problems.   This information is not intended to replace advice given to you by your health care provider. Make sure you discuss any questions you have with your health care provider.   Document Released: 03/06/2001 Document Revised: 12/19/2014 Document Reviewed: 03/28/2012 Elsevier Interactive Patient Education 2016 [employer].  Keep dressing dry and intact.  May shower after dressing changed on post-op day #4 (Sunday).  Cover sutures with Band-Aids after drying off. Apply ice  frequently to knee. May weight-bear as tolerated - use crutches or walker as needed. Follow-up in 10-14 days or as scheduled.

## 2016-06-01 NOTE — Anesthesia Procedure Notes (Signed)
Procedure Name: LMA Insertion Date/Time: 06/01/2016 1:38 PM Performed by: Londell Moh Pre-anesthesia Checklist: Patient identified, Emergency Drugs available, Suction available, Timeout performed and Patient being monitored Patient Re-evaluated:Patient Re-evaluated prior to inductionOxygen Delivery Method: Circle system utilized Preoxygenation: Pre-oxygenation with 100% oxygen Intubation Type: IV induction LMA: LMA inserted LMA Size: 4.0 Number of attempts: 1 Placement Confirmation: positive ETCO2 and breath sounds checked- equal and bilateral Tube secured with: Tape

## 2016-06-01 NOTE — H&P (Signed)
Paper H&P to be scanned into permanent record. H&P reviewed. No changes. 

## 2016-06-01 NOTE — Anesthesia Preprocedure Evaluation (Signed)
Anesthesia Evaluation  Patient identified by MRN, date of birth, ID band Patient awake    Reviewed: Allergy & Precautions, NPO status , Patient's Chart, lab work & pertinent test results  Airway Mallampati: I  TM Distance: >3 FB Neck ROM: Full    Dental no notable dental hx.    Pulmonary neg pulmonary ROS,    Pulmonary exam normal        Cardiovascular negative cardio ROS Normal cardiovascular exam     Neuro/Psych negative neurological ROS  negative psych ROS   GI/Hepatic negative GI ROS, Neg liver ROS,   Endo/Other  Pre-diabetes  Renal/GU negative Renal ROS     Musculoskeletal  (+) Arthritis , Osteoarthritis,    Abdominal   Peds  Hematology negative hematology ROS (+)   Anesthesia Other Findings   Reproductive/Obstetrics                             Anesthesia Physical Anesthesia Plan  ASA: II  Anesthesia Plan: General   Post-op Pain Management:    Induction: Intravenous  Airway Management Planned: LMA  Additional Equipment:   Intra-op Plan:   Post-operative Plan:   Informed Consent: I have reviewed the patients History and Physical, chart, labs and discussed the procedure including the risks, benefits and alternatives for the proposed anesthesia with the patient or authorized representative who has indicated his/her understanding and acceptance.     Plan Discussed with: CRNA  Anesthesia Plan Comments:         Anesthesia Quick Evaluation

## 2016-06-01 NOTE — Op Note (Signed)
06/01/2016  2:19 PM  Patient:   Jacqueline Rush  Pre-Op Diagnosis:   Complex tear medial meniscus, right knee.  Postoperative diagnosis:   Complex tear of medial meniscus with early degenerative joint disease and symptomatic suprapatellar plica, right knee.  Procedure:   Arthroscopic partial medial meniscectomy, arthroscopic abrasion chondroplasty of femoral trochlea, and debridement of symptomatic plica, right knee.  Surgeon:   Pascal Lux, M.D.  Anesthesia:   General LMA.  Findings:   As above. There were grade 2 chondromalacial changes involving the central femoral trochlea. There was a focal area of grade 2-3 chondral malacial changes along the medial edge of the medial femoral condyle, consistent with a kissing lesion, associated with a suprapatellar plica. The remaining articular surfaces all were in satisfactory condition. The lateral meniscus was in satisfactory condition, as were the anterior and posterior cruciate ligaments.  Complications:   None.  EBL:   5 cc.  Total fluids:   650 cc of crystalloid.  Tourniquet time:   None  Drains:   None  Closure:   4-0 Prolene interrupted sutures.  Brief clinical note:   The patient is a 55 year old female with a several month history of medial-sided right knee pain. Her symptoms have progressed despite medications, activity modification, etc. Her history and examination were consistent with a medial meniscus tear confirmed by MRI scan. The patient presents at this time for arthroscopy, debridement, and partial medial meniscectomy.  Procedure:   The patient was brought into the operating room and lain in the supine position. After adequate general laryngal mask anesthesia was obtained, a timeout was performed to verify the appropriate side. The patient's right knee was injected sterilely using a solution of 30 cc of 1% lidocaine and 30 cc of 0.5% Sensorcaine with epinephrine. The right lower extremity was prepped with ChloraPrep  solution before being draped sterilely. Preoperative antibiotics were administered. The expected portal sites were injected with 0.5% Sensorcaine with epinephrine before the camera was placed in the anterolateral portal and instrumentation performed through the anteromedial portal. The knee was sequentially examined beginning in the suprapatellar pouch, then progressing to the patellofemoral space, the medial gutter compartment, the notch, and finally the lateral compartment and gutter. The findings were as described above. Abundant reactive synovial tissues anteriorly were debrided using the full-radius resector in order to improve visualization. This debridement included debridement of a symptomatic medial shelf plica. The complex tear of the posterior medial portion of the medial meniscus was debrided back to stable margins using a combination of the mini-munchers and full-radius resector. Subsequent probing of the remaining rim demonstrated good stability. The area of chondromalacia consistent with a kissing lesion also was debrided back to stable margins using the full-radius resector. Laterally, the meniscus was intact to probing, as were the cruciate ligaments. The instruments were removed from the joint after suctioning the excess fluid. The portal sites were closed using 4-0 Prolene interrupted sutures before a sterile bulky dressing was applied to the knee. The patient was then awakened, extubated, and returned to the recovery room in satisfactory condition after tolerating the procedure well.

## 2016-06-01 NOTE — Transfer of Care (Signed)
Immediate Anesthesia Transfer of Care Note  Patient: [name] Files  Procedure(s) Performed: Procedure(s): KNEE ARTHROSCOPY WITH DEBRIDEMENT MEDIAL MENISECTOMY (Right)  Patient Location: PACU  Anesthesia Type: General  Level of Consciousness: awake, alert  and patient cooperative  Airway and Oxygen Therapy: Patient Spontanous Breathing and Patient connected to supplemental oxygen  Post-op Assessment: Post-op Vital signs reviewed, Patient's Cardiovascular Status Stable, Respiratory Function Stable, Patent Airway and No signs of Nausea or vomiting  Post-op Vital Signs: Reviewed and stable  Complications: No apparent anesthesia complications

## 2016-06-01 NOTE — Anesthesia Postprocedure Evaluation (Signed)
Anesthesia Post Note  Patient: Ronneisha Stellato Stuard  Procedure(s) Performed: Procedure(s) (LRB): Arthroscopic partial medial meniscectomy, arthroscopic abrasion chondroplasty of femoral trochlea, and debridement of symptomatic plica, right knee. (Right)  Patient location during evaluation: PACU Anesthesia Type: General Level of consciousness: awake and alert and oriented Pain management: pain level controlled Vital Signs Assessment: post-procedure vital signs reviewed and stable Respiratory status: spontaneous breathing and nonlabored ventilation Cardiovascular status: stable Postop Assessment: no signs of nausea or vomiting and adequate PO intake Anesthetic complications: no    Estill Batten

## 2016-06-02 ENCOUNTER — Encounter: Payer: Self-pay | Admitting: Surgery

## 2016-06-22 ENCOUNTER — Other Ambulatory Visit: Payer: Managed Care, Other (non HMO)

## 2016-06-22 NOTE — Patient Instructions (Signed)
  Your procedure is scheduled on: 06-30-16 Report to Same Day Surgery 2nd floor medical mall To find out your arrival time please call [phone] between 1PM - 3PM on 06-29-16  Remember: Instructions that are not followed completely may result in serious medical risk, up to and including death, or upon the discretion of your surgeon and anesthesiologist your surgery may need to be rescheduled.    _x___ 1. Do not eat food or drink liquids after midnight. No gum chewing or hard candies.     __x__ 2. No Alcohol for 24 hours before or after surgery.   __x__3. No Smoking for 24 prior to surgery.   ____  4. Bring all medications with you on the day of surgery if instructed.    __x__ 5. Notify your doctor if there is any change in your medical condition     (cold, fever, infections).     Do not wear jewelry, make-up, hairpins, clips or nail polish.  Do not wear lotions, powders, or perfumes. You may wear deodorant.  Do not shave 48 hours prior to surgery. Men may shave face and neck.  Do not bring valuables to the hospital.    [facility] is not responsible for any belongings or valuables.               Contacts, dentures or bridgework may not be worn into surgery.  Leave your suitcase in the car. After surgery it may be brought to your room.  For patients admitted to the hospital, discharge time is determined by your treatment team.   Patients discharged the day of surgery will not be allowed to drive home.    Please read over the following fact sheets that you were given:   [facility] Preparing for Surgery and or MRSA Information   _x___ Take these medicines the morning of surgery with A SIP OF WATER:    1. NONE  2.  3.  4.  5.  6.  ____ Fleet Enema (as directed)   ____ Use CHG Soap or sage wipes as directed on instruction sheet   ____ Use inhalers on the day of surgery and bring to hospital day of surgery  ____ Stop metformin 2 days prior to surgery    ____ Take 1/2 of  usual insulin dose the night before surgery and none on the morning of surgery.   ____ Stop aspirin or coumadin, or plavix  _x__ Stop Anti-inflammatories such as Advil, Aleve, Ibuprofen, Motrin, Naproxen,          Naprosyn, Goodies powders or aspirin products. Ok to take TylenoL OR HYDROCODONE   ____ Stop supplements until after surgery.    ____ Bring C-Pap to the hospital.

## 2016-06-30 ENCOUNTER — Ambulatory Visit: Payer: Managed Care, Other (non HMO) | Admitting: Anesthesiology

## 2016-06-30 ENCOUNTER — Encounter: Payer: Self-pay | Admitting: *Deleted

## 2016-06-30 ENCOUNTER — Encounter
Admission: RE | Disposition: A | Discharge status: Home / Self Care | Payer: Self-pay | Source: Ambulatory Visit | Attending: Surgery

## 2016-06-30 ENCOUNTER — Ambulatory Visit
Admission: RE | Admit: 2016-06-30 | Discharge: 2016-06-30 | Disposition: A | Discharge status: Home / Self Care | Payer: Managed Care, Other (non HMO) | Source: Ambulatory Visit | Attending: Surgery | Admitting: Surgery

## 2016-06-30 DIAGNOSIS — Z8249 Family history of ischemic heart disease and other diseases of the circulatory system: Secondary | ICD-10-CM | POA: Diagnosis not present

## 2016-06-30 DIAGNOSIS — M19011 Primary osteoarthritis, right shoulder: Secondary | ICD-10-CM | POA: Diagnosis not present

## 2016-06-30 DIAGNOSIS — X58XXXA Exposure to other specified factors, initial encounter: Secondary | ICD-10-CM | POA: Diagnosis not present

## 2016-06-30 DIAGNOSIS — Z79899 Other long term (current) drug therapy: Secondary | ICD-10-CM | POA: Insufficient documentation

## 2016-06-30 DIAGNOSIS — Z800 Family history of malignant neoplasm of digestive organs: Secondary | ICD-10-CM | POA: Insufficient documentation

## 2016-06-30 DIAGNOSIS — Z885 Allergy status to narcotic agent status: Secondary | ICD-10-CM | POA: Insufficient documentation

## 2016-06-30 DIAGNOSIS — Z8042 Family history of malignant neoplasm of prostate: Secondary | ICD-10-CM | POA: Insufficient documentation

## 2016-06-30 DIAGNOSIS — Z91041 Radiographic dye allergy status: Secondary | ICD-10-CM | POA: Insufficient documentation

## 2016-06-30 DIAGNOSIS — Z833 Family history of diabetes mellitus: Secondary | ICD-10-CM | POA: Insufficient documentation

## 2016-06-30 DIAGNOSIS — S43431A Superior glenoid labrum lesion of right shoulder, initial encounter: Secondary | ICD-10-CM | POA: Diagnosis present

## 2016-06-30 DIAGNOSIS — Z808 Family history of malignant neoplasm of other organs or systems: Secondary | ICD-10-CM | POA: Insufficient documentation

## 2016-06-30 HISTORY — PX: SHOULDER ARTHROSCOPY WITH LABRAL REPAIR: SHX5691

## 2016-06-30 HISTORY — PX: SHOULDER ARTHROSCOPY WITH DEBRIDEMENT AND BICEP TENDON REPAIR: SHX5690

## 2016-06-30 LAB — POCT PREGNANCY, URINE: PREG TEST UR: NEGATIVE

## 2016-06-30 SURGERY — ARTHROSCOPY, SHOULDER, WITH GLENOID LABRUM REPAIR
Anesthesia: General | Site: Shoulder | Laterality: Left | Wound class: Clean

## 2016-06-30 MED ORDER — EPINEPHRINE HCL 1 MG/ML IJ SOLN
INTRAMUSCULAR | Status: AC
  Filled 2016-06-30: qty 2

## 2016-06-30 MED ORDER — PHENYLEPHRINE HCL 10 MG/ML IJ SOLN
10.0000 mg | INTRAVENOUS | Status: DC | PRN
  Administered 2016-06-30: 20 ug/min via INTRAVENOUS

## 2016-06-30 MED ORDER — BUPIVACAINE-EPINEPHRINE 0.5% -1:200000 IJ SOLN
INTRAMUSCULAR | Status: DC | PRN
  Administered 2016-06-30: 30 mL

## 2016-06-30 MED ORDER — PROPOFOL 10 MG/ML IV BOLUS
INTRAVENOUS | Status: DC | PRN
  Administered 2016-06-30: 120 mg via INTRAVENOUS

## 2016-06-30 MED ORDER — FENTANYL CITRATE (PF) 100 MCG/2ML IJ SOLN
INTRAMUSCULAR | Status: DC | PRN
  Administered 2016-06-30: 25 ug via INTRAVENOUS
  Administered 2016-06-30: 100 ug via INTRAVENOUS
  Administered 2016-06-30: 50 ug via INTRAVENOUS
  Administered 2016-06-30: 225 ug via INTRAVENOUS

## 2016-06-30 MED ORDER — ACETAMINOPHEN 10 MG/ML IV SOLN
INTRAVENOUS | Status: AC
  Filled 2016-06-30: qty 100

## 2016-06-30 MED ORDER — ROCURONIUM BROMIDE 100 MG/10ML IV SOLN
INTRAVENOUS | Status: DC | PRN
  Administered 2016-06-30: 50 mg via INTRAVENOUS

## 2016-06-30 MED ORDER — BUPIVACAINE HCL (PF) 0.25 % IJ SOLN: INTRAMUSCULAR | Status: DC | PRN

## 2016-06-30 MED ORDER — DEXAMETHASONE SODIUM PHOSPHATE 4 MG/ML IJ SOLN
INTRAMUSCULAR | Status: DC | PRN
  Administered 2016-06-30: 5 mg via INTRAVENOUS

## 2016-06-30 MED ORDER — ACETAMINOPHEN 10 MG/ML IV SOLN
INTRAVENOUS | Status: DC | PRN
  Administered 2016-06-30: 1000 mg via INTRAVENOUS

## 2016-06-30 MED ORDER — FAMOTIDINE 20 MG PO TABS
20.0000 mg | ORAL_TABLET | Freq: Once | ORAL | Status: AC
  Administered 2016-06-30: 20 mg via ORAL

## 2016-06-30 MED ORDER — FAMOTIDINE 20 MG PO TABS
ORAL_TABLET | ORAL | Status: AC
  Filled 2016-06-30: qty 1

## 2016-06-30 MED ORDER — LACTATED RINGERS IV SOLN
INTRAVENOUS | Status: DC
Start: 2016-06-30 — End: 2016-06-30
  Administered 2016-06-30: 07:00:00 via INTRAVENOUS

## 2016-06-30 MED ORDER — BUPIVACAINE-EPINEPHRINE (PF) 0.5% -1:200000 IJ SOLN
INTRAMUSCULAR | Status: AC
  Filled 2016-06-30: qty 30

## 2016-06-30 MED ORDER — BUPIVACAINE HCL (PF) 0.25 % IJ SOLN
INTRAMUSCULAR | Status: DC | PRN
  Administered 2016-06-30: 20 mL via EPIDURAL

## 2016-06-30 MED ORDER — MIDAZOLAM HCL 2 MG/2ML IJ SOLN
INTRAMUSCULAR | Status: DC | PRN
  Administered 2016-06-30: 2 mg via INTRAVENOUS

## 2016-06-30 MED ORDER — CEFAZOLIN SODIUM-DEXTROSE 2-4 GM/100ML-% IV SOLN
2.0000 g | Freq: Once | INTRAVENOUS | Status: AC
  Administered 2016-06-30: 2 g via INTRAVENOUS

## 2016-06-30 MED ORDER — EPINEPHRINE HCL 1 MG/ML IJ SOLN
INTRAMUSCULAR | Status: DC | PRN
  Administered 2016-06-30: 2 mL

## 2016-06-30 MED ORDER — CEFAZOLIN SODIUM-DEXTROSE 2-4 GM/100ML-% IV SOLN
INTRAVENOUS | Status: AC
  Filled 2016-06-30: qty 100

## 2016-06-30 MED ORDER — LIDOCAINE HCL (CARDIAC) 20 MG/ML IV SOLN
INTRAVENOUS | Status: DC | PRN
  Administered 2016-06-30: 40 mg via INTRAVENOUS

## 2016-06-30 MED ORDER — GLYCOPYRROLATE 0.2 MG/ML IJ SOLN
INTRAMUSCULAR | Status: DC | PRN
  Administered 2016-06-30: 0.2 mg via INTRAVENOUS

## 2016-06-30 MED ORDER — NEOSTIGMINE METHYLSULFATE 10 MG/10ML IV SOLN
INTRAVENOUS | Status: DC | PRN
  Administered 2016-06-30: 1.5 mg via INTRAVENOUS

## 2016-06-30 MED ORDER — BUPIVACAINE HCL (PF) 0.5 % IJ SOLN
INTRAMUSCULAR | Status: AC
  Filled 2016-06-30: qty 30

## 2016-06-30 MED ORDER — VASOPRESSIN 20 UNIT/ML IV SOLN
INTRAVENOUS | Status: DC | PRN
  Administered 2016-06-30: 2 [IU] via INTRAVENOUS

## 2016-06-30 MED ORDER — OXYCODONE HCL 5 MG PO TABS: 5.0000 mg | ORAL_TABLET | ORAL | Status: DC | PRN

## 2016-06-30 SURGICAL SUPPLY — 53 items
ANCHOR JUGGERKNOT WTAP NDL 2.9 (Anchor) ×3 IMPLANT
ANCHOR SUTURETAK 3X12.7 BIOC (SUTURE) ×15 IMPLANT
BIT DRILL JUGRKNT W/NDL BIT2.9 (DRILL) ×2 IMPLANT
BLADE FULL RADIUS 3.5 (BLADE) ×3 IMPLANT
BLADE SHAVER 4.5X7 STR FR (MISCELLANEOUS) IMPLANT
BUR ACROMIONIZER 4.0 (BURR) ×3 IMPLANT
BUR BR 5.5 WIDE MOUTH (BURR) IMPLANT
Biocomposite suture tak knotless ×15 IMPLANT
CANNULA SHAVER 8MMX76MM (CANNULA) ×6 IMPLANT
CHLORAPREP W/TINT 26ML (MISCELLANEOUS) ×6 IMPLANT
COVER MAYO STAND STRL (DRAPES) ×3 IMPLANT
DECANTER SPIKE VIAL GLASS SM (MISCELLANEOUS) ×3 IMPLANT
DRAPE IMP U-DRAPE 54X76 (DRAPES) ×6 IMPLANT
DRILL JUGGERKNOT W/NDL BIT 2.9 (DRILL) ×3
DRSG OPSITE POSTOP 4X8 (GAUZE/BANDAGES/DRESSINGS) ×3 IMPLANT
ELECT REM PT RETURN 9FT ADLT (ELECTROSURGICAL) ×3
ELECTRODE REM PT RTRN 9FT ADLT (ELECTROSURGICAL) ×2 IMPLANT
GAUZE PETRO XEROFOAM 1X8 (MISCELLANEOUS) ×3 IMPLANT
GAUZE SPONGE 4X4 12PLY STRL (GAUZE/BANDAGES/DRESSINGS) ×3 IMPLANT
GLOVE BIO SURGEON STRL SZ7.5 (GLOVE) ×6 IMPLANT
GLOVE BIO SURGEON STRL SZ8 (GLOVE) ×6 IMPLANT
GLOVE BIOGEL PI IND STRL 8 (GLOVE) ×2 IMPLANT
GLOVE BIOGEL PI INDICATOR 8 (GLOVE) ×1
GLOVE INDICATOR 8.0 STRL GRN (GLOVE) ×3 IMPLANT
GOWN STRL REUS W/ TWL LRG LVL3 (GOWN DISPOSABLE) ×4 IMPLANT
GOWN STRL REUS W/ TWL XL LVL3 (GOWN DISPOSABLE) ×2 IMPLANT
GOWN STRL REUS W/TWL LRG LVL3 (GOWN DISPOSABLE) ×2
GOWN STRL REUS W/TWL XL LVL3 (GOWN DISPOSABLE) ×1
GRASPER SUT 15 45D LOW PRO (SUTURE) ×3 IMPLANT
IV LACTATED RINGER IRRG 3000ML (IV SOLUTION) ×2
IV LR IRRIG 3000ML ARTHROMATIC (IV SOLUTION) ×4 IMPLANT
KIT SUTURETAK 3.0 INSERT PERC (KITS) ×3 IMPLANT
MANIFOLD NEPTUNE II (INSTRUMENTS) ×3 IMPLANT
MASK FACE SPIDER DISP (MASK) ×3 IMPLANT
MAT BLUE FLOOR 46X72 FLO (MISCELLANEOUS) ×3 IMPLANT
NEEDLE REVERSE CUT 1/2 CRC (NEEDLE) IMPLANT
PACK ARTHROSCOPY SHOULDER (MISCELLANEOUS) ×3 IMPLANT
PASSER SUT 45 REELPASS CVD LFT (SUTURE) ×3 IMPLANT
PASSER SUT 45 REELPASS CVD RT (SUTURE) ×3 IMPLANT
REELPASS SUTURELASSO -45 DEGREE CURVE LEFT IMPLANT
REELPASS SUTURELASSO-45 DEGREE CURVE RIGHT IMPLANT
SLING ARM LRG DEEP (SOFTGOODS) IMPLANT
SLING ULTRA II LG (MISCELLANEOUS) ×3 IMPLANT
STAPLER SKIN PROX 35W (STAPLE) ×3 IMPLANT
STRAP SAFETY BODY (MISCELLANEOUS) ×3 IMPLANT
SUT ETHIBOND 0 (SUTURE) ×3 IMPLANT
SUT ETHIBOND 0 MO6 C/R (SUTURE) ×3 IMPLANT
SUT VIC AB 2-0 CT1 27 (SUTURE) ×2
SUT VIC AB 2-0 CT1 TAPERPNT 27 (SUTURE) ×4 IMPLANT
TAPE MICROFOAM 4IN (TAPE) ×3 IMPLANT
TUBING ARTHRO INFLOW-ONLY STRL (TUBING) ×3 IMPLANT
TUBING CONNECTING 10 (TUBING) ×3 IMPLANT
WAND HAND CNTRL MULTIVAC 90 (MISCELLANEOUS) ×3 IMPLANT

## 2016-06-30 NOTE — Op Note (Signed)
06/30/2016  10:40 AM  Patient:   Jacqueline Rush  Pre-Op Diagnosis:   Extensive SLAP tear with Bankart tear, right shoulder.  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  Procedure: Extensive arthroscopic debridement, arthroscopic SLAP repair, arthroscopic Bankart repair, and mini-open biceps tenodesis, right shoulder.  Anesthesia: General endotracheal with interscalene block placed preoperatively by the anesthesiologist.  Surgeon:   Pascal Lux, MD  Assistant:   Cameron Proud, PA-C  Findings: As above. There was an area of grade 4 chondromalacia involving the anterior third of the glenoid measuring approximately 1 x 2 cm. There also were grade 3 chondromalacial changes on the anterior portion of the humeral head. The labral tear extended from 6:30 position inferiorly to the 1:30 position postero-superiorly. The rotator cuff was in excellent condition.  Complications: None  Fluids:   1000 cc  Estimated blood loss: 10 cc  Tourniquet time: None  Drains: None  Closure: Staples   Brief clinical note: The patient is a 55 year old female with a history of left shoulder pain following an injury at home. The patient's symptoms have progressed despite medications, activity modification, etc. The patient's history and examination are consistent with a SLAP tear. A recent MRI scan demonstrated the presence of a large rotator cuff tear involving the entire superior labrum as well as extending anteriorly around to the inferior aspect of the glenoid. The patient presents at this time for repair of the large labral tear.  Procedure: The patient was brought into the operating room and lain in the supine position. After adequate IV sedation was achieved, the patient underwent placement of an interscalene block by the anesthesiologist. The patient then underwent general endotracheal intubation and anesthesia  before being repositioned in the beach chair position using the beach chair positioner. The left shoulder and upper extremity were prepped with ChloraPrep solution before being draped sterilely. Preoperative antibiotics were administered. A timeout was performed to confirm the proper surgical site before the expected portal sites and incision site were injected with 0.5% Sensorcaine with epinephrine. A posterior portal was created and the glenohumeral joint thoroughly inspected with the findings as described above. An anterior portal was created using an outside-in technique. The labrum and rotator cuff were further probed, again confirming the above-noted findings. The areas of loose articular cartilage were debrided using the full-radius resector, as were areas of synovitis anteriorly and superiorly. The frayed portion of the labrum anteriorly and superiorly were debrided back to stable margins using the full-radius resector. The biceps tendon was released from its labral anchor using the ArthroCare wand before the exposed glenoid rim was roughened with the full-radius resector and end-cutting rasp down to bleeding bone. The extensive labral tear was repaired using a total of five Arthrex SutureTak anchors and utilizing several supplemental portals. The instruments were removed from the joint after suctioning the excess fluid.  An approximately 4-5 cm incision was made over the anterolateral aspect of the shoulder beginning at the anterolateral corner of the acromion and extending distally in line with the bicipital groove. This incision was carried down through the subcutaneous tissues to expose the deltoid fascia. The raphae between the anterior and middle thirds was identified and this plane developed to provide access into the subacromial space. Additional bursal tissues were debrided sharply using Metzenbaum scissors. The rotator cuff was carefully inspected but no bursal surface tears were identified. The  bicipital groove was identified by palpation and opened for 1-1.5 cm. The biceps tendon stump  was retrieved through this defect. The floor of the bicipital groove was roughened with a curet before a Biomet 2.9 mm JuggerKnot anchor was inserted. Both sets of sutures were passed through the biceps tendon and tied securely to effect the tenodesis. The bicipital sheath was reapproximated using two #0 Ethibond interrupted sutures, incorporating the biceps tendon to further reinforce the tenodesis.  The wound was copiously irrigated with sterile saline solution before the deltoid raphae was reapproximated using 2-0 Vicryl interrupted sutures. The subcutaneous tissues were closed in two layers using 2-0 Vicryl interrupted sutures before the skin was closed using staples. The portal sites also were closed using staples. A sterile bulky dressing was applied to the shoulder before the arm was placed into a shoulder immobilizer. The patient was then awakened, extubated, and returned to the recovery room in satisfactory condition after tolerating the procedure well.

## 2016-06-30 NOTE — H&P (Signed)
Paper H&P to be scanned into permanent record. H&P reviewed. No changes. 

## 2016-06-30 NOTE — Progress Notes (Signed)
Shoulder immobilizer on   Ice pack applied to left shoulder

## 2016-06-30 NOTE — Anesthesia Preprocedure Evaluation (Signed)
Anesthesia Evaluation  Patient identified by MRN, date of birth, ID band Patient awake    Reviewed: Allergy & Precautions, NPO status , Patient's Chart, lab work & pertinent test results, reviewed documented beta blocker date and time   Airway Mallampati: II  TM Distance: >3 FB     Dental  (+) Chipped   Pulmonary           Cardiovascular      Neuro/Psych    GI/Hepatic   Endo/Other    Renal/GU      Musculoskeletal  (+) Arthritis ,   Abdominal   Peds  Hematology   Anesthesia Other Findings   Reproductive/Obstetrics                             Anesthesia Physical Anesthesia Plan  ASA: II  Anesthesia Plan: General   Post-op Pain Management:    Induction: Intravenous  Airway Management Planned: Oral ETT  Additional Equipment:   Intra-op Plan:   Post-operative Plan:   Informed Consent: I have reviewed the patients History and Physical, chart, labs and discussed the procedure including the risks, benefits and alternatives for the proposed anesthesia with the patient or authorized representative who has indicated his/her understanding and acceptance.     Plan Discussed with: CRNA  Anesthesia Plan Comments:         Anesthesia Quick Evaluation

## 2016-06-30 NOTE — Anesthesia Procedure Notes (Addendum)
Procedure Name: Intubation Date/Time: 06/30/2016 7:53 AM Performed by: Rosaria Ferries, DAVID Pre-anesthesia Checklist: Patient identified, Emergency Drugs available, Suction available and Patient being monitored Patient Re-evaluated:Patient Re-evaluated prior to inductionOxygen Delivery Method: Circle system utilized Preoxygenation: Pre-oxygenation with 100% oxygen Intubation Type: IV induction Laryngoscope Size: Mac and 3 Grade View: Grade I Tube type: Oral Tube size: 7.0 mm Number of attempts: 1 Placement Confirmation: ETT inserted through vocal cords under direct vision,  positive ETCO2 and breath sounds checked- equal and bilateral Secured at: 20 cm Tube secured with: Tape   Anesthesia Regional Block:  Interscalene brachial plexus block  Pre-Anesthetic Checklist: ,, timeout performed, Correct Patient, Correct Site, Correct Laterality, Correct Procedure, Correct Position, site marked, Risks and benefits discussed,  Surgical consent,  Pre-op evaluation,  At surgeon's request and post-op pain management   Prep: Betadine       Needles:  Injection technique: Single-shot  Needle Type: Echogenic Stimulator Needle     Needle Length: 5cm 5 cm Needle Gauge: 21 and 21 G    Additional Needles:  Procedures: ultrasound guided (picture in chart) and nerve stimulator Interscalene brachial plexus block  Nerve Stimulator or Paresthesia:  Response: biceps flexion, 0.8 mA,   Additional Responses:   Narrative:  Injection made incrementally with aspirations every 5 mL.  Performed by: Personally  Anesthesiologist: Gunnar Bulla  Additional Notes: Functioning IV was confirmed and monitors were applied.  A 33mm 22ga Arrow echogenic stimulator needle was used. Sterile prep and drape,hand hygiene and sterile gloves were used.  Negative aspiration and negative test dose prior to incremental administration of local anesthetic. The patient tolerated the procedure well.  Ultrasound guidance:  relevent anatomy identified, needle position confirmed, local anesthetic spread visualized around nerve(s), vascular puncture avoided. OR nurses unable to print image. Marcaine .25 20 ml and epi 0.1mg  at 0745.

## 2016-06-30 NOTE — Discharge Instructions (Addendum)
Keep dressing dry and intact.  May shower after dressing changed on post-op day #4 (Monday).  Cover staples with Band-Aids after drying off. Apply ice frequently to shoulder. Keep shoulder immobilizer on at all times except may remove for bathing purposes. Follow-up in 10-14 days or as scheduled. AMBULATORY SURGERY  DISCHARGE INSTRUCTIONS   1) The drugs that you were given will stay in your system until tomorrow so for the next 24 hours you should not:  A) Drive an automobile B) Make any legal decisions C) Drink any alcoholic beverage   2) You may resume regular meals tomorrow.  Today it is better to start with liquids and gradually work up to solid foods.  You may eat anything you prefer, but it is better to start with liquids, then soup and crackers, and gradually work up to solid foods.   3) Please notify your doctor immediately if you have any unusual bleeding, trouble breathing, redness and pain at the surgery site, drainage, fever, or pain not relieved by medication.    4) Additional Instructions:   Please contact your physician with any problems or Same Day Surgery at [phone], Monday through Friday 6 am to 4 pm, or [place] at [facility] number at [phone].

## 2016-06-30 NOTE — Anesthesia Postprocedure Evaluation (Signed)
Anesthesia Post Note  Patient: Jacqueline Rush  Procedure(s) Performed: Procedure(s) (LRB): SHOULDER ARTHROSCOPY WITH LABRAL REPAIR (Left) SHOULDER ARTHROSCOPY WITH DEBRIDEMENT AND BICEP TENDON REPAIR (Left)  Patient location during evaluation: Other Anesthesia Type: General Level of consciousness: awake and alert Pain management: pain level controlled Vital Signs Assessment: post-procedure vital signs reviewed and stable Respiratory status: spontaneous breathing, nonlabored ventilation, respiratory function stable and patient connected to nasal cannula oxygen Cardiovascular status: blood pressure returned to baseline and stable Postop Assessment: no signs of nausea or vomiting Anesthetic complications: no    Last Vitals:  Filed Vitals:   06/30/16 1154 06/30/16 1301  BP: 122/67 123/75  Pulse: 55 70  Temp: 36.7 C   Resp: 16 16    Last Pain:  Filed Vitals:   06/30/16 1307  PainSc: 0-No pain                 [name] S

## 2016-06-30 NOTE — Transfer of Care (Signed)
Immediate Anesthesia Transfer of Care Note  Patient: Jacqueline Rush  Procedure(s) Performed: Procedure(s): SHOULDER ARTHROSCOPY WITH LABRAL REPAIR (Left) SHOULDER ARTHROSCOPY WITH DEBRIDEMENT AND BICEP TENDON REPAIR (Left)  Patient Location: PACU  Anesthesia Type:General  Level of Consciousness: awake, alert , oriented and patient cooperative  Airway & Oxygen Therapy: Patient Spontanous Breathing and Patient connected to nasal cannula oxygen  Post-op Assessment: Report given to RN and Post -op Vital signs reviewed and stable  Post vital signs: Reviewed and stable  Last Vitals:  Filed Vitals:   06/30/16 0631  BP: 124/83  Pulse: 78  Temp: 36.4 C  Resp: 18    Last Pain: There were no vitals filed for this visit.    Patients Stated Pain Goal: 0 ([id number])  Complications: No apparent anesthesia complications

## 2016-07-01 DIAGNOSIS — M19112 Post-traumatic osteoarthritis, left shoulder: Secondary | ICD-10-CM | POA: Insufficient documentation

## 2016-07-01 NOTE — Addendum Note (Signed)
Addendum  created 07/01/16 1804 by Gunnar Bulla, MD   Modules edited: Anesthesia Events, Narrator   Narrator:  Narrator: Event Log Edited

## 2016-07-08 ENCOUNTER — Encounter: Payer: Self-pay | Admitting: Surgery

## 2016-09-02 ENCOUNTER — Other Ambulatory Visit: Payer: Self-pay | Admitting: Student

## 2016-09-02 DIAGNOSIS — M25561 Pain in right knee: Secondary | ICD-10-CM

## 2016-09-02 DIAGNOSIS — M1711 Unilateral primary osteoarthritis, right knee: Secondary | ICD-10-CM

## 2016-09-15 ENCOUNTER — Ambulatory Visit: Payer: Managed Care, Other (non HMO)

## 2016-09-20 ENCOUNTER — Ambulatory Visit
Admission: RE | Admit: 2016-09-20 | Discharge: 2016-09-20 | Disposition: A | Discharge status: Home / Self Care | Payer: Managed Care, Other (non HMO) | Source: Ambulatory Visit | Attending: Student | Admitting: Student

## 2016-09-20 DIAGNOSIS — M1711 Unilateral primary osteoarthritis, right knee: Secondary | ICD-10-CM | POA: Diagnosis present

## 2016-09-20 DIAGNOSIS — M949 Disorder of cartilage, unspecified: Secondary | ICD-10-CM | POA: Insufficient documentation

## 2016-09-20 DIAGNOSIS — M25461 Effusion, right knee: Secondary | ICD-10-CM | POA: Diagnosis not present

## 2016-09-20 DIAGNOSIS — Z9889 Other specified postprocedural states: Secondary | ICD-10-CM | POA: Insufficient documentation

## 2016-09-20 DIAGNOSIS — M25561 Pain in right knee: Secondary | ICD-10-CM

## 2017-01-24 ENCOUNTER — Ambulatory Visit (INDEPENDENT_AMBULATORY_CARE_PROVIDER_SITE_OTHER): Payer: Managed Care, Other (non HMO) | Admitting: Family Medicine

## 2017-01-24 ENCOUNTER — Encounter: Payer: Self-pay | Admitting: Family Medicine

## 2017-01-24 VITALS — BP 122/74 | HR 69 | Temp 98.1°F | Resp 16 | Wt 120.6 lb

## 2017-01-24 DIAGNOSIS — Z1231 Encounter for screening mammogram for malignant neoplasm of breast: Secondary | ICD-10-CM

## 2017-01-24 DIAGNOSIS — Z1239 Encounter for other screening for malignant neoplasm of breast: Secondary | ICD-10-CM | POA: Insufficient documentation

## 2017-01-24 DIAGNOSIS — Z1159 Encounter for screening for other viral diseases: Secondary | ICD-10-CM | POA: Diagnosis not present

## 2017-01-24 DIAGNOSIS — R7303 Prediabetes: Secondary | ICD-10-CM | POA: Diagnosis not present

## 2017-01-24 DIAGNOSIS — Z0000 Encounter for general adult medical examination without abnormal findings: Secondary | ICD-10-CM

## 2017-01-24 DIAGNOSIS — Z23 Encounter for immunization: Secondary | ICD-10-CM | POA: Diagnosis not present

## 2017-01-24 DIAGNOSIS — Z114 Encounter for screening for human immunodeficiency virus [HIV]: Secondary | ICD-10-CM | POA: Diagnosis not present

## 2017-01-24 LAB — CBC WITH DIFFERENTIAL/PLATELET
BASOS ABS: 50 {cells}/uL (ref 0–200)
BASOS PCT: 1 %
EOS PCT: 3 %
Eosinophils Absolute: 150 cells/uL (ref 15–500)
HCT: 42.9 % (ref 35.0–45.0)
HEMOGLOBIN: 13.9 g/dL (ref 11.7–15.5)
LYMPHS ABS: 1850 {cells}/uL (ref 850–3900)
Lymphocytes Relative: 37 %
MCH: 29 pg (ref 27.0–33.0)
MCHC: 32.4 g/dL (ref 32.0–36.0)
MCV: 89.4 fL (ref 80.0–100.0)
MPV: 11.4 fL (ref 7.5–12.5)
Monocytes Absolute: 400 cells/uL (ref 200–950)
Monocytes Relative: 8 %
NEUTROS ABS: 2550 {cells}/uL (ref 1500–7800)
Neutrophils Relative %: 51 %
Platelets: 276 10*3/uL (ref 140–400)
RBC: 4.8 MIL/uL (ref 3.80–5.10)
RDW: 13.5 % (ref 11.0–15.0)
WBC: 5 10*3/uL (ref 3.8–10.8)

## 2017-01-24 LAB — LIPID PANEL
CHOLESTEROL: 189 mg/dL (ref <200)
HDL: 107 mg/dL (ref >50)
LDL CALC: 68 mg/dL (ref <100)
TRIGLYCERIDES: 68 mg/dL (ref <150)
Total CHOL/HDL Ratio: 1.8 Ratio (ref <5.0)
VLDL: 14 mg/dL (ref <30)

## 2017-01-24 LAB — COMPREHENSIVE METABOLIC PANEL
ALT: 14 U/L (ref 6–29)
AST: 24 U/L (ref 10–35)
Albumin: 4.4 g/dL (ref 3.6–5.1)
Alkaline Phosphatase: 67 U/L (ref 33–130)
BILIRUBIN TOTAL: 0.7 mg/dL (ref 0.2–1.2)
BUN: 7 mg/dL (ref 7–25)
CO2: 31 mmol/L (ref 20–31)
Calcium: 9.6 mg/dL (ref 8.6–10.4)
Chloride: 104 mmol/L (ref 98–110)
Creat: 0.79 mg/dL (ref 0.50–1.05)
GLUCOSE: 90 mg/dL (ref 65–99)
Potassium: 4.2 mmol/L (ref 3.5–5.3)
SODIUM: 144 mmol/L (ref 135–146)
Total Protein: 7 g/dL (ref 6.1–8.1)

## 2017-01-24 NOTE — Progress Notes (Signed)
Patient ID: Jacqueline Rush, female   DOB: 12/13/1960, 56 y.o.   MRN: [id number]   Subjective:   Jacqueline Rush is a 56 y.o. female here for a complete physical exam  Interim issues since last visit: none reported; patient is new to me; her other provided left the practice  USPSTF grade A and B recommendations Alcohol: no Depression:  Depression screen [facility] 2/9 01/24/2017 12/09/2015  Decreased Interest 0 0  Down, Depressed, Hopeless 0 0  PHQ - 2 Score 0 0   Hypertension: controlled Obesity: no Tobacco use: no HIV, hep B, hep C: ordered today STD testing and prevention (chl/gon/syphilis): declined Lipids: today, truly fasting Glucose: today, truly fasting Colorectal cancer: done 01/19/2015 Breast cancer:  Jan 2017 BRCA gene screening: no breast cancer, no fam hx of ovarian plus breast Intimate partner violence: no abuse Cervical cancer screening: abnormal 15 years ago, during having babies; last 3 + have been normal;  Lung cancer: n/a Osteoporosis: n/a, low risk Fall prevention/vitamin D: not outdoors as much as she'd like; runs low in family, not taking a supplement Aspirin: no and will check 10 year ASCVD risk calculator Diet:  Healthy eating, knows about good vs bad fats Exercise: yes, active; coach and fitness advisor Skin cancer: one needs to be removed on the anterior left hip; went to have it removed several years ago, they said it was cosmetic so never got it done; in a bad spot; gets dry and irritated; right at panty line; she indulges in tanning; it's her one vice  Past Medical History:  Diagnosis Date  . Allergy   . Arthritis   . Family history of adverse reaction to anesthesia    dad had hiccups for 10 days after surgery  . Pre-diabetes   . Varicose veins    Past Surgical History:  Procedure Laterality Date  . BREAST CYST ASPIRATION Left   . CESAREAN SECTION    . EXTERNAL EAR SURGERY     pt states had right ear surgery  . KNEE ARTHROSCOPY WITH MEDIAL  MENISECTOMY Right 06/01/2016   Procedure: Arthroscopic partial medial meniscectomy, arthroscopic abrasion chondroplasty of femoral trochlea, and debridement of symptomatic plica, right knee.;  Surgeon: Corky Mull, MD;  Location: [facility];  Service: Orthopedics;  Laterality: Right;  . SHOULDER ARTHROSCOPY WITH DEBRIDEMENT AND BICEP TENDON REPAIR Left 06/30/2016   Procedure: SHOULDER ARTHROSCOPY WITH DEBRIDEMENT AND BICEP TENDON REPAIR;  Surgeon: Corky Mull, MD;  Location: [facility];  Service: Orthopedics;  Laterality: Left;  . SHOULDER ARTHROSCOPY WITH LABRAL REPAIR Left 06/30/2016   Procedure: SHOULDER ARTHROSCOPY WITH LABRAL REPAIR;  Surgeon: Corky Mull, MD;  Location: [facility];  Service: Orthopedics;  Laterality: Left;   Family History  Problem Relation Age of Onset  . Cancer Father     colon, prostate, skin  . Diabetes Father   . Colon cancer Father   . Prostate cancer Father   . Ulcerative colitis Daughter   . Hypertension Mother   . Heart disease Mother     heart attack in 2017  . Cancer Maternal Uncle     prostate and colon  . Colon cancer Maternal Uncle   . Prostate cancer Maternal Uncle   . Stroke Maternal Grandmother   . Heart disease Maternal Grandfather     died from a heart attack  . Cancer Paternal Grandfather     colon  . Colon cancer Paternal Grandfather    Social History  Substance Use Topics  .  Smoking status: Never Smoker  . Smokeless tobacco: Never Used  . Alcohol use No   Review of Systems  Objective:   Vitals:   01/24/17 1104  BP: 122/74  Pulse: 69  Resp: 16  Temp: 98.1 F (36.7 C)  TempSrc: Oral  SpO2: 92%  Weight: 120 lb 9 oz (54.7 kg)   Body mass index is 23.55 kg/m. Wt Readings from Last 3 Encounters:  01/24/17 120 lb 9 oz (54.7 kg)  06/30/16 114 lb (51.7 kg)  06/01/16 116 lb (52.6 kg)   Physical Exam  Constitutional: She appears well-developed and well-nourished.  HENT:  Head: Normocephalic and atraumatic.  Right Ear:  Hearing, tympanic membrane, external ear and ear canal normal.  Left Ear: Hearing, tympanic membrane, external ear and ear canal normal.  Eyes: Conjunctivae and EOM are normal. Right eye exhibits no hordeolum. Left eye exhibits no hordeolum. No scleral icterus.  Neck: Carotid bruit is not present. No thyromegaly present.  Cardiovascular: Normal rate, regular rhythm, S1 normal, S2 normal and normal heart sounds.   No extrasystoles are present.  Pulmonary/Chest: Effort normal and breath sounds normal. No respiratory distress. Right breast exhibits no inverted nipple, no mass, no nipple discharge, no skin change and no tenderness. Left breast exhibits no inverted nipple, no mass, no nipple discharge, no skin change and no tenderness. Breasts are symmetrical.  Abdominal: Soft. Normal appearance and bowel sounds are normal. She exhibits no distension, no abdominal bruit, no pulsatile midline mass and no mass. There is no hepatosplenomegaly. There is no tenderness. No hernia.  Musculoskeletal: Normal range of motion. She exhibits no edema.  Lymphadenopathy:       Head (right side): No submandibular adenopathy present.       Head (left side): No submandibular adenopathy present.    She has no cervical adenopathy.    She has no axillary adenopathy.  Neurological: She is alert. She displays no tremor. No cranial nerve deficit. She exhibits normal muscle tone. Gait normal.  Reflex Scores:      Patellar reflexes are 2+ on the right side and 2+ on the left side. Skin: Skin is warm and dry. Lesion (soft, uniformly greyish-brown lesion over left anterior hip/upper left thigh; soft, well-defined margins, waxy keratotic surface) noted. No bruising and no ecchymosis noted. No cyanosis. No pallor.  Tanned skin; several cherry angiomas  Psychiatric: Her speech is normal and behavior is normal. Thought content normal. Her mood appears not anxious. She does not exhibit a depressed mood.   Assessment/Plan:   Problem  List Items Addressed This Visit      Other   Prediabetes    Check A1c      Relevant Orders   Hemoglobin A1c   Breast cancer screening    Order mammo      Relevant Orders   MM DIGITAL SCREENING BILATERAL   Annual physical exam - Primary    USPSTF grade A and B recommendations reviewed with patient; age-appropriate recommendations, preventive care, screening tests, etc discussed and encouraged; healthy living encouraged; see AVS for patient education given to patient      Relevant Orders   Lipid panel   CBC with Differential/Platelet   TSH   Comprehensive metabolic panel    Other Visit Diagnoses    Needs flu shot       Relevant Orders   Flu Vaccine QUAD 36+ mos PF IM (Fluarix & Fluzone Quad PF) (Completed)   Encounter for screening for HIV  Relevant Orders   HIV antibody (with reflex)   Need for hepatitis C screening test       Relevant Orders   Hepatitis C Antibody    ABCDs of skin cancer reviewed; cautioned against tanning  No orders of the defined types were placed in this encounter.  Orders Placed This Encounter  Procedures  . MM DIGITAL SCREENING BILATERAL    Standing Status:   Future    Standing Expiration Date:   01/24/2018    Order Specific Question:   Reason for Exam (SYMPTOM  OR DIAGNOSIS REQUIRED)    Answer:   screening    Order Specific Question:   Is the patient pregnant?    Answer:   No    Order Specific Question:   Preferred imaging location?    Answer:   [facility]  . Flu Vaccine QUAD 36+ mos PF IM (Fluarix & Fluzone Quad PF)  . HIV antibody (with reflex)  . Hepatitis C Antibody  . Lipid panel  . CBC with Differential/Platelet  . Hemoglobin A1c  . TSH  . Comprehensive metabolic panel   Follow up plan: Return in about 1 year (around 01/24/2018) for complete physical.  An after-visit summary was printed and given to the patient at [employer].  Please see the patient instructions which may contain other information and recommendations  beyond what is mentioned above in the assessment and plan.

## 2017-01-24 NOTE — Assessment & Plan Note (Signed)
USPSTF grade A and B recommendations reviewed with patient; age-appropriate recommendations, preventive care, screening tests, etc discussed and encouraged; healthy living encouraged; see AVS for patient education given to patient  

## 2017-01-24 NOTE — Assessment & Plan Note (Signed)
Order mammo 

## 2017-01-24 NOTE — Assessment & Plan Note (Signed)
Check A1c. 

## 2017-01-24 NOTE — Patient Instructions (Addendum)
Please do call to schedule your mammogram; the number to schedule one at either [facility] or [facility] is [phone]  Please ask your mother's cardiologist if she has left ventricular outflow tract obstruction, because we would want to get an echo for you if so; she might have another condition involving her aortic valve or LVH, so please just let me know  Health Maintenance, Female Introduction Adopting a healthy lifestyle and getting preventive care can go a long way to promote health and wellness. Talk with your health care provider about what schedule of regular examinations is right for you. This is a good chance for you to check in with your provider about disease prevention and staying healthy. In between checkups, there are plenty of things you can do on your own. Experts have done a lot of research about which lifestyle changes and preventive measures are most likely to keep you healthy. Ask your health care provider for more information. Weight and diet Eat a healthy diet  Be sure to include plenty of vegetables, fruits, low-fat dairy products, and lean protein.  Do not eat a lot of foods high in solid fats, added sugars, or salt.  Get regular exercise. This is one of the most important things you can do for your health.  Most adults should exercise for at least 150 minutes each week. The exercise should increase your heart rate and make you sweat (moderate-intensity exercise).  Most adults should also do strengthening exercises at least twice a week. This is in addition to the moderate-intensity exercise. Maintain a healthy weight  Body mass index (BMI) is a measurement that can be used to identify possible weight problems. It estimates body fat based on height and weight. Your health care provider can help determine your BMI and help you achieve or maintain a healthy weight.  For females 44 years of age and older:  A BMI below 18.5 is  considered underweight.  A BMI of 18.5 to 24.9 is normal.  A BMI of 25 to 29.9 is considered overweight.  A BMI of 30 and above is considered obese. Watch levels of cholesterol and blood lipids  You should start having your blood tested for lipids and cholesterol at 54 years of age, then have this test every 5 years.  You may need to have your cholesterol levels checked more often if:  Your lipid or cholesterol levels are high.  You are older than 54 years of age.  You are at high risk for heart disease. Cancer screening Lung Cancer  Lung cancer screening is recommended for adults 69-64 years old who are at high risk for lung cancer because of a history of smoking.  A yearly low-dose CT scan of the lungs is recommended for people who:  Currently smoke.  Have quit within the past 15 years.  Have at least a 30-pack-year history of smoking. A pack year is smoking an average of one pack of cigarettes a day for 1 year.  Yearly screening should continue until it has been 15 years since you quit.  Yearly screening should stop if you develop a health problem that would prevent you from having lung cancer treatment. Breast Cancer  Practice breast self-awareness. This means understanding how your breasts normally appear and feel.  It also means doing regular breast self-exams. Let your health care provider know about any changes, no matter how small.  If you are in your 20s or 30s, you should have a clinical  breast exam (CBE) by a health care provider every 1-3 years as part of a regular health exam.  If you are 66 or older, have a CBE every year. Also consider having a breast X-ray (mammogram) every year.  If you have a family history of breast cancer, talk to your health care provider about genetic screening.  If you are at high risk for breast cancer, talk to your health care provider about having an MRI and a mammogram every year.  Breast cancer gene (BRCA) assessment is  recommended for women who have family members with BRCA-related cancers. BRCA-related cancers include:  Breast.  Ovarian.  Tubal.  Peritoneal cancers.  Results of the assessment will determine the need for genetic counseling and BRCA1 and BRCA2 testing. Cervical Cancer  Your health care provider may recommend that you be screened regularly for cancer of the pelvic organs (ovaries, uterus, and vagina). This screening involves a pelvic examination, including checking for microscopic changes to the surface of your cervix (Pap test). You may be encouraged to have this screening done every 3 years, beginning at age 48.  For women ages 74-65, health care providers may recommend pelvic exams and Pap testing every 3 years, or they may recommend the Pap and pelvic exam, combined with testing for human papilloma virus (HPV), every 5 years. Some types of HPV increase your risk of cervical cancer. Testing for HPV may also be done on women of any age with unclear Pap test results.  Other health care providers may not recommend any screening for nonpregnant women who are considered low risk for pelvic cancer and who do not have symptoms. Ask your health care provider if a screening pelvic exam is right for you.  If you have had past treatment for cervical cancer or a condition that could lead to cancer, you need Pap tests and screening for cancer for at least 20 years after your treatment. If Pap tests have been discontinued, your risk factors (such as having a new sexual partner) need to be reassessed to determine if screening should resume. Some women have medical problems that increase the chance of getting cervical cancer. In these cases, your health care provider may recommend more frequent screening and Pap tests. Colorectal Cancer  This type of cancer can be detected and often prevented.  Routine colorectal cancer screening usually begins at 54 years of age and continues through 54 years of  age.  Your health care provider may recommend screening at an earlier age if you have risk factors for colon cancer.  Your health care provider may also recommend using home test kits to check for hidden blood in the stool.  A small camera at the end of a tube can be used to examine your colon directly (sigmoidoscopy or colonoscopy). This is done to check for the earliest forms of colorectal cancer.  Routine screening usually begins at age 55.  Direct examination of the colon should be repeated every 5-10 years through 54 years of age. However, you may need to be screened more often if early forms of precancerous polyps or small growths are found. Skin Cancer  Check your skin from head to toe regularly.  Tell your health care provider about any new moles or changes in moles, especially if there is a change in a mole's shape or color.  Also tell your health care provider if you have a mole that is larger than the size of a pencil eraser.  Always use sunscreen. Apply sunscreen liberally  and repeatedly throughout the day.  Protect yourself by wearing long sleeves, pants, a wide-brimmed hat, and sunglasses whenever you are outside. Heart disease, diabetes, and high blood pressure  High blood pressure causes heart disease and increases the risk of stroke. High blood pressure is more likely to develop in:  People who have blood pressure in the high end of the normal range (130-139/85-89 mm Hg).  People who are overweight or obese.  People who are African American.  If you are 29-79 years of age, have your blood pressure checked every 3-5 years. If you are 51 years of age or older, have your blood pressure checked every year. You should have your blood pressure measured twice-once when you are at a hospital or clinic, and once when you are not at a hospital or clinic. Record the average of the two measurements. To check your blood pressure when you are not at a hospital or clinic, you can  use:  An automated blood pressure machine at a pharmacy.  A home blood pressure monitor.  If you are between 67 years and 43 years old, ask your health care provider if you should take aspirin to prevent strokes.  Have regular diabetes screenings. This involves taking a blood sample to check your fasting blood sugar level.  If you are at a normal weight and have a low risk for diabetes, have this test once every three years after 54 years of age.  If you are overweight and have a high risk for diabetes, consider being tested at a younger age or more often. Preventing infection Hepatitis B  If you have a higher risk for hepatitis B, you should be screened for this virus. You are considered at high risk for hepatitis B if:  You were born in a country where hepatitis B is common. Ask your health care provider which countries are considered high risk.  Your parents were born in a high-risk country, and you have not been immunized against hepatitis B (hepatitis B vaccine).  You have HIV or AIDS.  You use needles to inject street drugs.  You live with someone who has hepatitis B.  You have had sex with someone who has hepatitis B.  You get hemodialysis treatment.  You take certain medicines for conditions, including cancer, organ transplantation, and autoimmune conditions. Hepatitis C  Blood testing is recommended for:  Everyone born from [birth year] through 1963.  Anyone with known risk factors for hepatitis C. Sexually transmitted infections (STIs)  You should be screened for sexually transmitted infections (STIs) including gonorrhea and chlamydia if:  You are sexually active and are younger than 54 years of age.  You are older than 54 years of age and your health care provider tells you that you are at risk for this type of infection.  Your sexual activity has changed since you were last screened and you are at an increased risk for chlamydia or gonorrhea. Ask your health care  provider if you are at risk.  If you do not have HIV, but are at risk, it may be recommended that you take a prescription medicine daily to prevent HIV infection. This is called pre-exposure prophylaxis (PrEP). You are considered at risk if:  You are sexually active and do not regularly use condoms or know the HIV status of your partner(s).  You take drugs by injection.  You are sexually active with a partner who has HIV. Talk with your health care provider about whether you are at high  risk of being infected with HIV. If you choose to begin PrEP, you should first be tested for HIV. You should then be tested every 3 months for as long as you are taking PrEP. Pregnancy  If you are premenopausal and you may become pregnant, ask your health care provider about preconception counseling.  If you may become pregnant, take 400 to 800 micrograms (mcg) of folic acid every day.  If you want to prevent pregnancy, talk to your health care provider about birth control (contraception). Osteoporosis and menopause  Osteoporosis is a disease in which the bones lose minerals and strength with aging. This can result in serious bone fractures. Your risk for osteoporosis can be identified using a bone density scan.  If you are 37 years of age or older, or if you are at risk for osteoporosis and fractures, ask your health care provider if you should be screened.  Ask your health care provider whether you should take a calcium or vitamin D supplement to lower your risk for osteoporosis.  Menopause may have certain physical symptoms and risks.  Hormone replacement therapy may reduce some of these symptoms and risks. Talk to your health care provider about whether hormone replacement therapy is right for you. Follow these instructions at home:  Schedule regular health, dental, and eye exams.  Stay current with your immunizations.  Do not use any tobacco products including cigarettes, chewing tobacco, or  electronic cigarettes.  If you are pregnant, do not drink alcohol.  If you are breastfeeding, limit how much and how often you drink alcohol.  Limit alcohol intake to no more than 1 drink per day for nonpregnant women. One drink equals 12 ounces of beer, 5 ounces of wine, or 1 ounces of hard liquor.  Do not use street drugs.  Do not share needles.  Ask your health care provider for help if you need support or information about quitting drugs.  Tell your health care provider if you often feel depressed.  Tell your health care provider if you have ever been abused or do not feel safe at home. This information is not intended to replace advice given to you by your health care provider. Make sure you discuss any questions you have with your health care provider. Document Released: 06/13/2011 Document Revised: 05/05/2016 Document Reviewed: 09/01/2015  2017 [employer]

## 2017-01-25 LAB — HEMOGLOBIN A1C
HEMOGLOBIN A1C: 5.5 % (ref <5.7)
Mean Plasma Glucose: 111 mg/dL

## 2017-01-25 LAB — HEPATITIS C ANTIBODY: HCV Ab: NEGATIVE

## 2017-01-25 LAB — TSH: TSH: 1.86 m[IU]/L

## 2017-01-25 LAB — HIV ANTIBODY (ROUTINE TESTING W REFLEX): HIV 1&2 Ab, 4th Generation: NONREACTIVE

## 2017-12-12 DIAGNOSIS — Z8614 Personal history of Methicillin resistant Staphylococcus aureus infection: Secondary | ICD-10-CM

## 2017-12-12 HISTORY — DX: Personal history of Methicillin resistant Staphylococcus aureus infection: Z86.14

## 2018-01-29 ENCOUNTER — Ambulatory Visit (INDEPENDENT_AMBULATORY_CARE_PROVIDER_SITE_OTHER): Payer: Self-pay | Admitting: Family Medicine

## 2018-01-29 ENCOUNTER — Encounter: Payer: Self-pay | Admitting: Family Medicine

## 2018-01-29 VITALS — BP 128/80 | HR 70 | Temp 98.5°F | Resp 14 | Ht 60.0 in | Wt 120.0 lb

## 2018-01-29 DIAGNOSIS — Z1231 Encounter for screening mammogram for malignant neoplasm of breast: Secondary | ICD-10-CM

## 2018-01-29 NOTE — Progress Notes (Signed)
  Patient ID: Jacqueline Rush, female   DOB: Jun 14, 1961, 57 y.o.   MRN: [id number]  ** Patient was rescheduled **  Subjective:   Jacqueline Rush is a 57 y.o. female here for a complete physical exam   Review of Systems  Objective:   Physical Exam  Assessment/Plan:   Problem List Items Addressed This Visit    None    Visit Diagnoses    Screening for breast cancer    -  Primary   Relevant Orders   MM Digital Screening      No orders of the defined types were placed in this encounter.  Orders Placed This Encounter  Procedures  . MM Digital Screening    Standing Status:   Future    Standing Expiration Date:   03/30/2019    Order Specific Question:   Reason for Exam (SYMPTOM  OR DIAGNOSIS REQUIRED)    Answer:   screen for breast cancer    Order Specific Question:   Is the patient pregnant?    Answer:   No    Order Specific Question:   Preferred imaging location?    Answer:   [facility]    Follow up plan: No Follow-up on file.  An After Visit Summary was printed and given to the patient.

## 2018-02-27 ENCOUNTER — Encounter: Payer: Self-pay | Admitting: Family Medicine

## 2018-02-27 ENCOUNTER — Ambulatory Visit: Payer: Self-pay | Admitting: Family Medicine

## 2018-02-27 VITALS — BP 132/80 | HR 98 | Temp 98.4°F | Ht 60.0 in | Wt 113.9 lb

## 2018-02-27 DIAGNOSIS — Z1211 Encounter for screening for malignant neoplasm of colon: Secondary | ICD-10-CM

## 2018-02-27 DIAGNOSIS — L821 Other seborrheic keratosis: Secondary | ICD-10-CM | POA: Insufficient documentation

## 2018-02-27 DIAGNOSIS — Z1231 Encounter for screening mammogram for malignant neoplasm of breast: Secondary | ICD-10-CM

## 2018-02-27 DIAGNOSIS — Z124 Encounter for screening for malignant neoplasm of cervix: Secondary | ICD-10-CM

## 2018-02-27 DIAGNOSIS — Z0000 Encounter for general adult medical examination without abnormal findings: Secondary | ICD-10-CM

## 2018-02-27 DIAGNOSIS — Z113 Encounter for screening for infections with a predominantly sexual mode of transmission: Secondary | ICD-10-CM

## 2018-02-27 DIAGNOSIS — N898 Other specified noninflammatory disorders of vagina: Secondary | ICD-10-CM

## 2018-02-27 NOTE — Progress Notes (Signed)
Patient ID: LAKE BREEDING, female   DOB: 1961-09-21, 57 y.o.   MRN: [id number]   Subjective:   Jacqueline Rush is a 57 y.o. 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 Rolling with tennis balls, bought some insoles and tried that Aggravating more than anything; about 1.5 weeks ago she was doing a work-out of burpees over PVC pipe and had a good rhythm but landed on the ball of her foot and could feel a pulling over the heel; left foot; symptoms have been present in both feet actually; lots of jumping that day; knows to back off when it bothers her  She also has another issues; not sure if age-related; waking up in the middle of the night now; around 2:30 am; sometimes just lays in bed for an hour, might be up for an hour or more; for periods of time, during that time, resting quietly and it's dark; not watching TV and on tablet or phone usually; she might pick up the phone just briefly to read something or make notes if she is really awake; the last month has gotten worse; a lot going on at home too; stress-related; tries to process her day before going to bed  USPSTF grade A and B recommendations Depression:  Depression screen [facility] 2/9 02/27/2018 01/29/2018 01/24/2017 12/09/2015  Decreased Interest 0 0 0 0  Down, Depressed, Hopeless 0 0 0 0  PHQ - 2 Score 0 0 0 0   Hypertension: BP Readings from Last 3 Encounters:  02/27/18 132/80  01/29/18 128/80  01/24/17 122/74   Obesity: Wt Readings from Last 3 Encounters:  02/27/18 113 lb 14.4 oz (51.7 kg)  01/29/18 120 lb (54.4 kg)  01/24/17 120 lb 9 oz (54.7 kg)   BMI Readings from Last 3 Encounters:  02/27/18 22.24 kg/m  01/29/18 23.44 kg/m  01/24/17 23.55 kg/m    Skin cancer: nothing worrisome Lung cancer:  n/a Breast  cancer: ordered mammo Colorectal cancer: last done in 2011; next was supposed to be 5 years she thinks Cervical cancer screening: last time Dec 2016; hx of abnormal pap smear many years ago; well over 10 years ago; precancerous cells on the cervix BRCA gene screening: family hx of breast and/or ovarian cancer and/or metastatic prostate cancer? Father has had prostate cancer, treated, but did not spread; maternal aunt had ovaries removed but not sure why HIV, hep B, hep C: done last year, retest STD testing and prevention (chl/gon/syphilis): not sexually active Intimate partner violence: no abuse Contraception: not sexually active; last menstrual period was maybe a year ago Osteoporosis: n/a Fall prevention/vitamin D: discussed Immunizations: new shingles vaccine Diet: no specific diet; trying to keep carbs low Exercise: yes, power lifting, perrsonal trainer; used to do more crossfit style training, just picked up the power lifting; tore left labrum 2 years ago; just had surgery July 2017; set her back a little Alcohol: no Tobacco use: no AAA: n/a Aspirin: n/a Glucose: check Glucose  Date Value Ref Range Status  12/02/2015 86 65 - 99 mg/dL Final  09/23/2014 85 65 - 99 mg/dL Final   Glucose, Bld  Date Value Ref Range Status  01/24/2017 90 65 - 99 mg/dL Final   Lipids:  Lab Results  Component Value Date   CHOL  189 01/24/2017   CHOL 174 12/02/2015   Lab Results  Component Value Date   HDL 107 01/24/2017   HDL 89 12/02/2015   Lab Results  Component Value Date   LDLCALC 68 01/24/2017   LDLCALC 73 12/02/2015   Lab Results  Component Value Date   TRIG 68 01/24/2017   TRIG 61 12/02/2015   Lab Results  Component Value Date   CHOLHDL 1.8 01/24/2017   CHOLHDL 2.0 12/02/2015   No results found for: LDLDIRECT   Past Medical History:  Diagnosis Date  . Allergy   . Arthritis   . Family history of adverse reaction to anesthesia    dad had hiccups for 10 days after surgery   . Pre-diabetes   . Varicose veins    Past Surgical History:  Procedure Laterality Date  . BREAST CYST ASPIRATION Left   . CESAREAN SECTION    . EXTERNAL EAR SURGERY     pt states had right ear surgery  . KNEE ARTHROSCOPY WITH MEDIAL MENISECTOMY Right 06/01/2016   Procedure: Arthroscopic partial medial meniscectomy, arthroscopic abrasion chondroplasty of femoral trochlea, and debridement of symptomatic plica, right knee.;  Surgeon: Corky Mull, MD;  Location: [place];  Service: Orthopedics;  Laterality: Right;  . SHOULDER ARTHROSCOPY WITH DEBRIDEMENT AND BICEP TENDON REPAIR Left 06/30/2016   Procedure: SHOULDER ARTHROSCOPY WITH DEBRIDEMENT AND BICEP TENDON REPAIR;  Surgeon: Corky Mull, MD;  Location: [facility];  Service: Orthopedics;  Laterality: Left;  . SHOULDER ARTHROSCOPY WITH LABRAL REPAIR Left 06/30/2016   Procedure: SHOULDER ARTHROSCOPY WITH LABRAL REPAIR;  Surgeon: Corky Mull, MD;  Location: [facility];  Service: Orthopedics;  Laterality: Left;   Family History  Problem Relation Age of Onset  . Cancer Father        colon, prostate, skin  . Diabetes Father   . Colon cancer Father   . Prostate cancer Father   . Ulcerative colitis Daughter   . Hypertension Mother   . Heart disease Mother        heart attack in 2017  . Cancer Maternal Uncle        prostate and colon  . Colon cancer Maternal Uncle   . Prostate cancer Maternal Uncle   . Stroke Maternal Grandmother   . Heart disease Maternal Grandfather        died from a heart attack  . Cancer Paternal Grandfather        colon  . Colon cancer Paternal Grandfather    Social History   Tobacco Use  . Smoking status: Never Smoker  . Smokeless tobacco: Never Used  Substance Use Topics  . Alcohol use: Yes    Alcohol/week: 0.0 oz    Comment: occasional  . Drug use: No   Review of Systems  Objective:   Vitals:   02/27/18 1520 02/27/18 1521  BP: 132/80   Pulse: (!) 104 98  Temp: 98.4 F (36.9 C)    TempSrc: Oral   SpO2: 98%   Weight: 113 lb 14.4 oz (51.7 kg)   Height: 5' (1.524 m)    Body mass index is 22.24 kg/m. Wt Readings from Last 3 Encounters:  02/27/18 113 lb 14.4 oz (51.7 kg)  01/29/18 120 lb (54.4 kg)  01/24/17 120 lb 9 oz (54.7 kg)   Physical Exam  Constitutional: She appears well-developed and well-nourished.  HENT:  Head: Normocephalic and atraumatic.  Eyes: Conjunctivae and EOM are normal. Right eye exhibits no hordeolum. Left eye exhibits no hordeolum.  No scleral icterus.  Neck: Carotid bruit is not present. No thyromegaly present.  Cardiovascular: Normal rate, regular rhythm, S1 normal, S2 normal and normal heart sounds.  No extrasystoles are present.  Pulmonary/Chest: Effort normal and breath sounds normal. No respiratory distress. Right breast exhibits no inverted nipple, no mass, no nipple discharge, no skin change and no tenderness. Left breast exhibits no inverted nipple, no mass, no nipple discharge, no skin change and no tenderness. Breasts are symmetrical.  Abdominal: Soft. Normal appearance and bowel sounds are normal. She exhibits no distension, no abdominal bruit, no pulsatile midline mass and no mass. There is no hepatosplenomegaly. There is no tenderness. No hernia.  Genitourinary: Uterus normal. Pelvic exam was performed with patient prone. There is no rash or lesion on the right labia. There is no rash or lesion on the left labia. Cervix exhibits no motion tenderness. Right adnexum displays no mass, no tenderness and no fullness. Left adnexum displays no mass, no tenderness and no fullness.  Musculoskeletal: Normal range of motion. She exhibits no edema.       Right foot: There is normal range of motion, no bony tenderness, no swelling and no deformity.       Left foot: There is normal range of motion, no bony tenderness, no swelling and no deformity.  Lymphadenopathy:       Head (right side): No submandibular adenopathy present.       Head (left  side): No submandibular adenopathy present.    She has no cervical adenopathy.    She has no axillary adenopathy.  Neurological: She is alert. She displays no tremor. No cranial nerve deficit. She exhibits normal muscle tone. Gait normal.  Skin: Skin is warm and dry. No bruising and no ecchymosis noted. No cyanosis. No pallor.  Brown keratotic lesion  Psychiatric: Her speech is normal and behavior is normal. Thought content normal. Her mood appears not anxious. She does not exhibit a depressed mood.    Assessment/Plan:   Problem List Items Addressed This Visit      Musculoskeletal and Integument   Seborrheic keratosis   Relevant Orders   Ambulatory referral to Dermatology     Other   Annual physical exam - Primary    USPSTF grade A and B recommendations reviewed with patient; age-appropriate recommendations, preventive care, screening tests, etc discussed and encouraged; healthy living encouraged; see AVS for patient education given to patient       Relevant Orders   Ambulatory referral to Gastroenterology   HIV antibody   CBC with Differential/Platelet   TSH   Lipid panel   Hepatitis C antibody   COMPLETE METABOLIC PANEL WITH GFR    Other Visit Diagnoses    Encounter for screening mammogram for breast cancer       Relevant Orders   MM DIGITAL SCREENING BILATERAL   Pap smear for cervical cancer screening       Relevant Orders   Pap IG, CT/NG NAA, and HPV (high risk)   Screening for colon cancer       Relevant Orders   Ambulatory referral to Gastroenterology   Screening examination for STD (sexually transmitted disease)       Relevant Orders   HIV antibody   Hepatitis C antibody   Vaginal discharge       Relevant Orders   WET PREP BY MOLECULAR PROBE       No orders of the defined types were placed in this encounter.  Orders Placed This Encounter  Procedures  . WET PREP BY MOLECULAR PROBE  . WET PREP BY MOLECULAR PROBE  . MM DIGITAL SCREENING BILATERAL     Standing Status:   Future    Standing Expiration Date:   04/30/2019    Order Specific Question:   Reason for Exam (SYMPTOM  OR DIAGNOSIS REQUIRED)    Answer:   Screening breast cancer    Order Specific Question:   Is the patient pregnant?    Answer:   No    Order Specific Question:   Preferred imaging location?    Answer:   [facility]  . HIV antibody  . CBC with Differential/Platelet  . TSH  . Lipid panel  . Hepatitis C antibody  . COMPLETE METABOLIC PANEL WITH GFR  . Ambulatory referral to Dermatology    Referral Priority:   Routine    Referral Type:   Consultation    Referral Reason:   Specialty Services Required    Requested Specialty:   Dermatology    Number of Visits Requested:   1  . Ambulatory referral to Gastroenterology    Referral Priority:   Routine    Referral Type:   Consultation    Referral Reason:   Specialty Services Required    Number of Visits Requested:   1    Follow up plan: Return in about 1 year (around 02/28/2019) for complete physical.  An After Visit Summary was printed and given to the patient.

## 2018-02-27 NOTE — Assessment & Plan Note (Signed)
USPSTF grade A and B recommendations reviewed with patient; age-appropriate recommendations, preventive care, screening tests, etc discussed and encouraged; healthy living encouraged; see AVS for patient education given to patient  

## 2018-02-27 NOTE — Patient Instructions (Addendum)
Please do call to schedule your mammogram; the number to schedule one at either [facility] or [facility] is [phone] Try melatonin 3 mg for 3 weeks at the same time of night to see if that helps the sleep  Plantar Fasciitis Plantar fasciitis is a painful foot condition that affects the heel. It occurs when the band of tissue that connects the toes to the heel bone (plantar fascia) becomes irritated. This can happen after exercising too much or doing other repetitive activities (overuse injury). The pain from plantar fasciitis can range from mild irritation to severe pain that makes it difficult for you to walk or move. The pain is usually worse in the morning or after you have been sitting or lying down for a while. What are the causes? This condition may be caused by:  Standing for long periods of time.  Wearing shoes that do not fit.  Doing high-impact activities, including running, aerobics, and ballet.  Being overweight.  Having an abnormal way of walking (gait).  Having tight calf muscles.  Having high arches in your feet.  Starting a new athletic activity.  What are the signs or symptoms? The main symptom of this condition is heel pain. Other symptoms include:  Pain that gets worse after activity or exercise.  Pain that is worse in the morning or after resting.  Pain that goes away after you walk for a few minutes.  How is this diagnosed? This condition may be diagnosed based on your signs and symptoms. Your health care provider will also do a physical exam to check for:  A tender area on the bottom of your foot.  A high arch in your foot.  Pain when you move your foot.  Difficulty moving your foot.  You may also need to have imaging studies to confirm the diagnosis. These can include:  X-rays.  Ultrasound.  MRI.  How is this treated? Treatment for plantar fasciitis depends on the severity of the condition. Your treatment  may include:  Rest, ice, and over-the-counter pain medicines to manage your pain.  Exercises to stretch your calves and your plantar fascia.  A splint that holds your foot in a stretched, upward position while you sleep (night splint).  Physical therapy to relieve symptoms and prevent problems in the future.  Cortisone injections to relieve severe pain.  Extracorporeal shock wave therapy (ESWT) to stimulate damaged plantar fascia with electrical impulses. It is often used as a last resort before surgery.  Surgery, if other treatments have not worked after 12 months.  Follow these instructions at home:  Take medicines only as directed by your health care provider.  Avoid activities that cause pain.  Roll the bottom of your foot over a bag of ice or a bottle of cold water. Do this for 20 minutes, 3-4 times a day.  Perform simple stretches as directed by your health care provider.  Try wearing athletic shoes with air-sole or gel-sole cushions or soft shoe inserts.  Wear a night splint while sleeping, if directed by your health care provider.  Keep all follow-up appointments with your health care provider. How is this prevented?  Do not perform exercises or activities that cause heel pain.  Consider finding low-impact activities if you continue to have problems.  Lose weight if you need to. The best way to prevent plantar fasciitis is to avoid the activities that aggravate your plantar fascia. Contact a health care provider if:  Your symptoms do not  go away after treatment with home care measures.  Your pain gets worse.  Your pain affects your ability to move or do your daily activities. This information is not intended to replace advice given to you by your health care provider. Make sure you discuss any questions you have with your health care provider. Document Released: 08/23/2001 Document Revised: 05/02/2016 Document Reviewed: 10/08/2014 Elsevier Interactive Patient  Education  2018 [place] Maintenance, Female Adopting a healthy lifestyle and getting preventive care can go a long way to promote health and wellness. Talk with your health care provider about what schedule of regular examinations is right for you. This is a good chance for you to check in with your provider about disease prevention and staying healthy. In between checkups, there are plenty of things you can do on your own. Experts have done a lot of research about which lifestyle changes and preventive measures are most likely to keep you healthy. Ask your health care provider for more information. Weight and diet Eat a healthy diet  Be sure to include plenty of vegetables, fruits, low-fat dairy products, and lean protein.  Do not eat a lot of foods high in solid fats, added sugars, or salt.  Get regular exercise. This is one of the most important things you can do for your health. ? Most adults should exercise for at least 150 minutes each week. The exercise should increase your heart rate and make you sweat (moderate-intensity exercise). ? Most adults should also do strengthening exercises at least twice a week. This is in addition to the moderate-intensity exercise.  Maintain a healthy weight  Body mass index (BMI) is a measurement that can be used to identify possible weight problems. It estimates body fat based on height and weight. Your health care provider can help determine your BMI and help you achieve or maintain a healthy weight.  For females 90 years of age and older: ? A BMI below 18.5 is considered underweight. ? A BMI of 18.5 to 24.9 is normal. ? A BMI of 25 to 29.9 is considered overweight. ? A BMI of 30 and above is considered obese.  Watch levels of cholesterol and blood lipids  You should start having your blood tested for lipids and cholesterol at 55 years of age, then have this test every 5 years.  You may need to have your cholesterol levels checked  more often if: ? Your lipid or cholesterol levels are high. ? You are older than 55 years of age. ? You are at high risk for heart disease.  Cancer screening Lung Cancer  Lung cancer screening is recommended for adults 53-85 years old who are at high risk for lung cancer because of a history of smoking.  A yearly low-dose CT scan of the lungs is recommended for people who: ? Currently smoke. ? Have quit within the past 15 years. ? Have at least a 30-pack-year history of smoking. A pack year is smoking an average of one pack of cigarettes a day for 1 year.  Yearly screening should continue until it has been 15 years since you quit.  Yearly screening should stop if you develop a health problem that would prevent you from having lung cancer treatment.  Breast Cancer  Practice breast self-awareness. This means understanding how your breasts normally appear and feel.  It also means doing regular breast self-exams. Let your health care provider know about any changes, no matter how small.  If you are in your  26s or 48s, you should have a clinical breast exam (CBE) by a health care provider every 1-3 years as part of a regular health exam.  If you are 80 or older, have a CBE every year. Also consider having a breast X-ray (mammogram) every year.  If you have a family history of breast cancer, talk to your health care provider about genetic screening.  If you are at high risk for breast cancer, talk to your health care provider about having an MRI and a mammogram every year.  Breast cancer gene (BRCA) assessment is recommended for women who have family members with BRCA-related cancers. BRCA-related cancers include: ? Breast. ? Ovarian. ? Tubal. ? Peritoneal cancers.  Results of the assessment will determine the need for genetic counseling and BRCA1 and BRCA2 testing.  Cervical Cancer Your health care provider may recommend that you be screened regularly for cancer of the pelvic  organs (ovaries, uterus, and vagina). This screening involves a pelvic examination, including checking for microscopic changes to the surface of your cervix (Pap test). You may be encouraged to have this screening done every 3 years, beginning at age 13.  For women ages 83-65, health care providers may recommend pelvic exams and Pap testing every 3 years, or they may recommend the Pap and pelvic exam, combined with testing for human papilloma virus (HPV), every 5 years. Some types of HPV increase your risk of cervical cancer. Testing for HPV may also be done on women of any age with unclear Pap test results.  Other health care providers may not recommend any screening for nonpregnant women who are considered low risk for pelvic cancer and who do not have symptoms. Ask your health care provider if a screening pelvic exam is right for you.  If you have had past treatment for cervical cancer or a condition that could lead to cancer, you need Pap tests and screening for cancer for at least 20 years after your treatment. If Pap tests have been discontinued, your risk factors (such as having a new sexual partner) need to be reassessed to determine if screening should resume. Some women have medical problems that increase the chance of getting cervical cancer. In these cases, your health care provider may recommend more frequent screening and Pap tests.  Colorectal Cancer  This type of cancer can be detected and often prevented.  Routine colorectal cancer screening usually begins at 55 years of age and continues through 55 years of age.  Your health care provider may recommend screening at an earlier age if you have risk factors for colon cancer.  Your health care provider may also recommend using home test kits to check for hidden blood in the stool.  A small camera at the end of a tube can be used to examine your colon directly (sigmoidoscopy or colonoscopy). This is done to check for the earliest forms  of colorectal cancer.  Routine screening usually begins at age 45.  Direct examination of the colon should be repeated every 5-10 years through 55 years of age. However, you may need to be screened more often if early forms of precancerous polyps or small growths are found.  Skin Cancer  Check your skin from head to toe regularly.  Tell your health care provider about any new moles or changes in moles, especially if there is a change in a mole's shape or color.  Also tell your health care provider if you have a mole that is larger than the size of  a pencil eraser.  Always use sunscreen. Apply sunscreen liberally and repeatedly throughout the day.  Protect yourself by wearing long sleeves, pants, a wide-brimmed hat, and sunglasses whenever you are outside.  Heart disease, diabetes, and high blood pressure  High blood pressure causes heart disease and increases the risk of stroke. High blood pressure is more likely to develop in: ? People who have blood pressure in the high end of the normal range (130-139/85-89 mm Hg). ? People who are overweight or obese. ? People who are African American.  If you are 2-68 years of age, have your blood pressure checked every 3-5 years. If you are 22 years of age or older, have your blood pressure checked every year. You should have your blood pressure measured twice-once when you are at a hospital or clinic, and once when you are not at a hospital or clinic. Record the average of the two measurements. To check your blood pressure when you are not at a hospital or clinic, you can use: ? An automated blood pressure machine at a pharmacy. ? A home blood pressure monitor.  If you are between 95 years and 44 years old, ask your health care provider if you should take aspirin to prevent strokes.  Have regular diabetes screenings. This involves taking a blood sample to check your fasting blood sugar level. ? If you are at a normal weight and have a low risk  for diabetes, have this test once every three years after 55 years of age. ? If you are overweight and have a high risk for diabetes, consider being tested at a younger age or more often. Preventing infection Hepatitis B  If you have a higher risk for hepatitis B, you should be screened for this virus. You are considered at high risk for hepatitis B if: ? You were born in a country where hepatitis B is common. Ask your health care provider which countries are considered high risk. ? Your parents were born in a high-risk country, and you have not been immunized against hepatitis B (hepatitis B vaccine). ? You have HIV or AIDS. ? You use needles to inject street drugs. ? You live with someone who has hepatitis B. ? You have had sex with someone who has hepatitis B. ? You get hemodialysis treatment. ? You take certain medicines for conditions, including cancer, organ transplantation, and autoimmune conditions.  Hepatitis C  Blood testing is recommended for: ? Everyone born from [birth year] through 1963. ? Anyone with known risk factors for hepatitis C.  Sexually transmitted infections (STIs)  You should be screened for sexually transmitted infections (STIs) including gonorrhea and chlamydia if: ? You are sexually active and are younger than 55 years of age. ? You are older than 55 years of age and your health care provider tells you that you are at risk for this type of infection. ? Your sexual activity has changed since you were last screened and you are at an increased risk for chlamydia or gonorrhea. Ask your health care provider if you are at risk.  If you do not have HIV, but are at risk, it may be recommended that you take a prescription medicine daily to prevent HIV infection. This is called pre-exposure prophylaxis (PrEP). You are considered at risk if: ? You are sexually active and do not regularly use condoms or know the HIV status of your partner(s). ? You take drugs by  injection. ? You are sexually active with a partner who has  HIV.  Talk with your health care provider about whether you are at high risk of being infected with HIV. If you choose to begin PrEP, you should first be tested for HIV. You should then be tested every 3 months for as long as you are taking PrEP. Pregnancy  If you are premenopausal and you may become pregnant, ask your health care provider about preconception counseling.  If you may become pregnant, take 400 to 800 micrograms (mcg) of folic acid every day.  If you want to prevent pregnancy, talk to your health care provider about birth control (contraception). Osteoporosis and menopause  Osteoporosis is a disease in which the bones lose minerals and strength with aging. This can result in serious bone fractures. Your risk for osteoporosis can be identified using a bone density scan.  If you are 38 years of age or older, or if you are at risk for osteoporosis and fractures, ask your health care provider if you should be screened.  Ask your health care provider whether you should take a calcium or vitamin D supplement to lower your risk for osteoporosis.  Menopause may have certain physical symptoms and risks.  Hormone replacement therapy may reduce some of these symptoms and risks. Talk to your health care provider about whether hormone replacement therapy is right for you. Follow these instructions at home:  Schedule regular health, dental, and eye exams.  Stay current with your immunizations.  Do not use any tobacco products including cigarettes, chewing tobacco, or electronic cigarettes.  If you are pregnant, do not drink alcohol.  If you are breastfeeding, limit how much and how often you drink alcohol.  Limit alcohol intake to no more than 1 drink per day for nonpregnant women. One drink equals 12 ounces of beer, 5 ounces of wine, or 1 ounces of hard liquor.  Do not use street drugs.  Do not share needles.  Ask  your health care provider for help if you need support or information about quitting drugs.  Tell your health care provider if you often feel depressed.  Tell your health care provider if you have ever been abused or do not feel safe at home. This information is not intended to replace advice given to you by your health care provider. Make sure you discuss any questions you have with your health care provider. Document Released: 06/13/2011 Document Revised: 05/05/2016 Document Reviewed: 09/01/2015 Elsevier Interactive Patient Education  [employer].

## 2018-02-28 ENCOUNTER — Other Ambulatory Visit: Payer: Self-pay | Admitting: Family Medicine

## 2018-02-28 LAB — WET PREP BY MOLECULAR PROBE
CANDIDA SPECIES: NOT DETECTED
MICRO NUMBER:: 90345088
SPECIMEN QUALITY:: ADEQUATE
Trichomonas vaginosis: NOT DETECTED

## 2018-02-28 MED ORDER — METRONIDAZOLE 500 MG PO TABS: 500.0000 mg | ORAL_TABLET | Freq: Two times a day (BID) | ORAL | 0 refills | Status: DC

## 2018-02-28 NOTE — Progress Notes (Signed)
rx for bv treatment

## 2018-03-01 ENCOUNTER — Telehealth: Payer: Self-pay

## 2018-03-01 LAB — PAP IG, CT-NG NAA, HPV HIGH-RISK
C. trachomatis RNA, TMA: NOT DETECTED
HPV DNA HIGH RISK: NOT DETECTED
N. gonorrhoeae RNA, TMA: NOT DETECTED

## 2018-03-01 NOTE — Telephone Encounter (Signed)
Called pt and left a voicemail with the details of her wet mount results.

## 2018-03-02 ENCOUNTER — Telehealth: Payer: Self-pay

## 2018-03-02 NOTE — Telephone Encounter (Signed)
Called pt no answer. LM for pt informing her of neg-neg pap. CRM created. Labs routed to [facility]. Also sent text for mychart sign up.

## 2018-03-02 NOTE — Telephone Encounter (Signed)
-----   Message from Arnetha Courser, MD sent at 03/01/2018  5:03 PM EDT ----- Check w/lab to find out why age and menstrual status not given [place], let patient know that her pap smear was fine; HPV negative; thank you

## 2018-03-06 ENCOUNTER — Telehealth: Payer: Self-pay

## 2018-03-06 NOTE — Telephone Encounter (Signed)
I called this patient to inform her that she has been scheduled to see Dr. Alfonso Patten with [facility] on Friday, March 30, 2018 @ 8:30am.   She then asked for me to review her labs b/c she had gotten some mixed messages via voicemail. Labs were reviewed and patient was encouraged to pick up her medication.  Patient agreed and said thanks.

## 2018-03-14 ENCOUNTER — Encounter: Payer: Self-pay | Admitting: *Deleted

## 2018-08-01 ENCOUNTER — Ambulatory Visit (INDEPENDENT_AMBULATORY_CARE_PROVIDER_SITE_OTHER): Payer: Self-pay | Admitting: Family Medicine

## 2018-08-01 ENCOUNTER — Encounter: Payer: Self-pay | Admitting: Family Medicine

## 2018-08-01 VITALS — BP 120/68 | HR 97 | Temp 98.7°F | Resp 16 | Ht 60.0 in | Wt 125.0 lb

## 2018-08-01 DIAGNOSIS — L03116 Cellulitis of left lower limb: Secondary | ICD-10-CM

## 2018-08-01 MED ORDER — SULFAMETHOXAZOLE-TRIMETHOPRIM 800-160 MG PO TABS: 1.0000 | ORAL_TABLET | Freq: Two times a day (BID) | ORAL | 0 refills | Status: DC

## 2018-08-01 NOTE — Progress Notes (Signed)
Name: Jacqueline Rush   MRN: [id number]    DOB: [birth year]   Date:08/01/2018       Progress Note  Subjective  Chief Complaint  Chief Complaint  Patient presents with  . Insect Bite    on left knee, swollen, warm to the touch, red and painful for 5 days    HPI  PT presents with 5 day history of LEFT knee swelling and redness anteriorly.  She notes she was working out at [employer] and felt a sharp pain - questions if a spider bite - then noticed area became red, tender, swollen, and had a central raised area.  She noticed some very mild shortness of breath a few days ago while exercising vigorously after this occurred, but this has since completely resolved.  Denies fevers, chills, chest pain, tachycardia, red streaking up or down her leg. Does endorse LEFT knee swelling and some swelling just distal to the knee.    Patient Active Problem List   Diagnosis Date Noted  . Seborrheic keratosis 02/27/2018  . Breast cancer screening 01/24/2017  . Post-traumatic osteoarthritis of left shoulder 07/01/2016  . Primary osteoarthritis of right knee 05/27/2016  . Complex tear of medial meniscus of right knee as current injury 05/16/2016  . Rotator cuff tendinitis, left 05/16/2016  . Tear of left glenoid labrum 05/16/2016  . Annual physical exam 12/09/2015  . Encounter for screening mammogram for malignant neoplasm of breast 12/09/2015  . Skin lesion 12/09/2015  . Plantar warts 12/09/2015  . Need for immunization against influenza 12/09/2015  . Arthritis, multiple joint involvement 12/01/2015  . Varicose vein 12/01/2015  . Allergic rhinitis 12/01/2015  . Prediabetes 12/01/2015    Social History   Tobacco Use  . Smoking status: Never Smoker  . Smokeless tobacco: Never Used  Substance Use Topics  . Alcohol use: Yes    Alcohol/week: 0.0 standard drinks    Comment: occasional     Current Outpatient Medications:  .  metroNIDAZOLE (FLAGYL) 500 MG tablet, Take 1 tablet (500 mg total) by  mouth 2 (two) times daily. No alcohol or cold medicines that contain alcohol while on this med (Patient not taking: Reported on 08/01/2018), Disp: 14 tablet, Rfl: 0  Allergies  Allergen Reactions  . Iodinated Diagnostic Agents Swelling    Contrast dye- facial swelling  . Codeine Other (See Comments)    ROS  Ten systems reviewed and is negative except as mentioned in HPI.  Objective  Vitals:   08/01/18 1326  BP: 120/68  Pulse: 97  Resp: 16  Temp: 98.7 F (37.1 C)  TempSrc: Oral  SpO2: 95%  Weight: 125 lb (56.7 kg)  Height: 5' (1.524 m)   Body mass index is 24.41 kg/m.  Nursing Note and Vital Signs reviewed.  Physical Exam  Constitutional: Patient appears well-developed and well-nourished. No distress.  HENT: Head: Normocephalic and atraumatic. Eyes: Conjunctivae and EOM are normal. Neck: Normal range of motion. Neck supple. No JVD present. No thyromegaly present.  Cardiovascular: Normal rate, regular rhythm and normal heart sounds.  No murmur heard. No BLE edema. Pulmonary/Chest: Effort normal and breath sounds normal. No respiratory distress. Musculoskeletal: Normal range of motion, no joint effusions. Normal popliteal fossa bilaterally. Neurological: he is alert and oriented to person, place, and time. No cranial nerve deficit. Coordination, balance, strength, speech and gait are normal.  Skin: Skin is warm and dry. Erythema to the left anterior knee with central raised yellow-white head that has a deeper; deeper reddish-purple discoloration  in the center of the erythema.  No fluctuance, unable to extract any exudate for culture.  Area is hot to touch and tender.  Psychiatric: Patient has a normal mood and affect. behavior is normal. Judgment and thought content normal.   No results found for this or any previous visit (from the past 72 hour(s)).  Assessment & Plan  1. Cellulitis of left knee - sulfamethoxazole-trimethoprim (BACTRIM DS,SEPTRA DS) 800-160 MG tablet;  Take 1 tablet by mouth 2 (two) times daily for 7 days.  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  I was not able to obtain culture today due to a lack of exudate from the area. I advised if not improving in 1-2 days on antibiotic, she must return for further evaluation and consideration of ER visit for further evaluation and/or treatment.  -Red flags and when to present for emergency care or RTC including fever >101.54F, chest pain, shortness of breath, new/worsening/un-resolving symptoms, worsening swelling reviewed with patient at time of visit. Follow up and care instructions discussed and provided in AVS.

## 2018-08-02 ENCOUNTER — Emergency Department: Payer: Self-pay

## 2018-08-02 ENCOUNTER — Inpatient Hospital Stay
Admission: EM | Admit: 2018-08-02 | Discharge: 2018-08-07 | DRG: 872 | Disposition: A | Discharge status: Home / Self Care | Payer: Self-pay | Attending: Internal Medicine | Admitting: Internal Medicine

## 2018-08-02 ENCOUNTER — Other Ambulatory Visit: Payer: Self-pay

## 2018-08-02 ENCOUNTER — Ambulatory Visit: Payer: Self-pay | Admitting: *Deleted

## 2018-08-02 ENCOUNTER — Encounter: Payer: Self-pay | Admitting: *Deleted

## 2018-08-02 DIAGNOSIS — S80262A Insect bite (nonvenomous), left knee, initial encounter: Secondary | ICD-10-CM | POA: Diagnosis present

## 2018-08-02 DIAGNOSIS — Z885 Allergy status to narcotic agent status: Secondary | ICD-10-CM

## 2018-08-02 DIAGNOSIS — Z91041 Radiographic dye allergy status: Secondary | ICD-10-CM

## 2018-08-02 DIAGNOSIS — L03116 Cellulitis of left lower limb: Secondary | ICD-10-CM | POA: Diagnosis present

## 2018-08-02 DIAGNOSIS — L0390 Cellulitis, unspecified: Secondary | ICD-10-CM

## 2018-08-02 DIAGNOSIS — Z833 Family history of diabetes mellitus: Secondary | ICD-10-CM

## 2018-08-02 DIAGNOSIS — R7303 Prediabetes: Secondary | ICD-10-CM | POA: Diagnosis present

## 2018-08-02 DIAGNOSIS — A419 Sepsis, unspecified organism: Principal | ICD-10-CM | POA: Diagnosis present

## 2018-08-02 DIAGNOSIS — R062 Wheezing: Secondary | ICD-10-CM

## 2018-08-02 DIAGNOSIS — I8390 Asymptomatic varicose veins of unspecified lower extremity: Secondary | ICD-10-CM | POA: Diagnosis present

## 2018-08-02 LAB — CBC WITH DIFFERENTIAL/PLATELET
Basophils Absolute: 0 10*3/uL (ref 0–0.1)
Basophils Relative: 0 %
EOS PCT: 1 %
Eosinophils Absolute: 0.1 10*3/uL (ref 0–0.7)
HEMATOCRIT: 38.1 % (ref 35.0–47.0)
Hemoglobin: 12.6 g/dL (ref 12.0–16.0)
LYMPHS ABS: 0.9 10*3/uL — AB (ref 1.0–3.6)
LYMPHS PCT: 7 %
MCH: 29.9 pg (ref 26.0–34.0)
MCHC: 33.1 g/dL (ref 32.0–36.0)
MCV: 90.2 fL (ref 80.0–100.0)
MONO ABS: 1.1 10*3/uL — AB (ref 0.2–0.9)
Monocytes Relative: 8 %
Neutro Abs: 10.8 10*3/uL — ABNORMAL HIGH (ref 1.4–6.5)
Neutrophils Relative %: 84 %
PLATELETS: 231 10*3/uL (ref 150–440)
RBC: 4.23 MIL/uL (ref 3.80–5.20)
RDW: 14.1 % (ref 11.5–14.5)
WBC: 12.8 10*3/uL — ABNORMAL HIGH (ref 3.6–11.0)

## 2018-08-02 LAB — COMPREHENSIVE METABOLIC PANEL
ALT: 19 U/L (ref 0–44)
AST: 28 U/L (ref 15–41)
Albumin: 3.5 g/dL (ref 3.5–5.0)
Alkaline Phosphatase: 69 U/L (ref 38–126)
Anion gap: 6 (ref 5–15)
BILIRUBIN TOTAL: 0.4 mg/dL (ref 0.3–1.2)
BUN: 11 mg/dL (ref 6–20)
CHLORIDE: 106 mmol/L (ref 98–111)
CO2: 28 mmol/L (ref 22–32)
CREATININE: 0.96 mg/dL (ref 0.44–1.00)
Calcium: 8.9 mg/dL (ref 8.9–10.3)
Glucose, Bld: 135 mg/dL — ABNORMAL HIGH (ref 70–99)
POTASSIUM: 4 mmol/L (ref 3.5–5.1)
Sodium: 140 mmol/L (ref 135–145)
TOTAL PROTEIN: 6.9 g/dL (ref 6.5–8.1)

## 2018-08-02 LAB — LACTIC ACID, PLASMA: Lactic Acid, Venous: 1.3 mmol/L (ref 0.5–1.9)

## 2018-08-02 MED ORDER — SODIUM CHLORIDE 0.9 % IV BOLUS
1000.0000 mL | Freq: Once | INTRAVENOUS | Status: AC
  Administered 2018-08-02: 1000 mL via INTRAVENOUS

## 2018-08-02 MED ORDER — KETOROLAC TROMETHAMINE 30 MG/ML IJ SOLN
15.0000 mg | Freq: Once | INTRAMUSCULAR | Status: AC
  Administered 2018-08-02: 15 mg via INTRAVENOUS
  Filled 2018-08-02: qty 1

## 2018-08-02 MED ORDER — ACETAMINOPHEN 650 MG RE SUPP: 650.0000 mg | Freq: Four times a day (QID) | RECTAL | Status: DC | PRN

## 2018-08-02 MED ORDER — POLYETHYLENE GLYCOL 3350 17 G PO PACK: 17.0000 g | PACK | Freq: Every day | ORAL | Status: DC | PRN

## 2018-08-02 MED ORDER — FENTANYL CITRATE (PF) 100 MCG/2ML IJ SOLN
50.0000 ug | INTRAMUSCULAR | Status: DC | PRN
  Administered 2018-08-02: 50 ug via INTRAVENOUS
  Filled 2018-08-02: qty 2

## 2018-08-02 MED ORDER — ONDANSETRON HCL 4 MG PO TABS: 4.0000 mg | ORAL_TABLET | Freq: Four times a day (QID) | ORAL | Status: DC | PRN

## 2018-08-02 MED ORDER — ACETAMINOPHEN 325 MG PO TABS
650.0000 mg | ORAL_TABLET | Freq: Four times a day (QID) | ORAL | Status: DC | PRN
  Administered 2018-08-03 – 2018-08-05 (×3): 650 mg via ORAL
  Filled 2018-08-02 (×3): qty 2

## 2018-08-02 MED ORDER — ONDANSETRON HCL 4 MG/2ML IJ SOLN: 4.0000 mg | Freq: Four times a day (QID) | INTRAMUSCULAR | Status: DC | PRN

## 2018-08-02 MED ORDER — VANCOMYCIN HCL IN DEXTROSE 750-5 MG/150ML-% IV SOLN
750.0000 mg | INTRAVENOUS | Status: DC
  Administered 2018-08-03 – 2018-08-06 (×4): 750 mg via INTRAVENOUS
  Filled 2018-08-02 (×4): qty 150

## 2018-08-02 MED ORDER — BUPIVACAINE HCL (PF) 0.5 % IJ SOLN
30.0000 mL | Freq: Once | INTRAMUSCULAR | Status: AC
  Administered 2018-08-02: 30 mL
  Filled 2018-08-02: qty 30

## 2018-08-02 MED ORDER — ONDANSETRON HCL 4 MG/2ML IJ SOLN
4.0000 mg | Freq: Once | INTRAMUSCULAR | Status: AC
  Administered 2018-08-02: 4 mg via INTRAVENOUS
  Filled 2018-08-02: qty 2

## 2018-08-02 MED ORDER — ENOXAPARIN SODIUM 40 MG/0.4ML ~~LOC~~ SOLN
40.0000 mg | SUBCUTANEOUS | Status: DC
  Administered 2018-08-02 – 2018-08-05 (×4): 40 mg via SUBCUTANEOUS
  Filled 2018-08-02 (×4): qty 0.4

## 2018-08-02 MED ORDER — FENTANYL CITRATE (PF) 100 MCG/2ML IJ SOLN: 100.0000 ug | INTRAMUSCULAR | Status: DC | PRN

## 2018-08-02 MED ORDER — SODIUM CHLORIDE 0.9 % IV SOLN
INTRAVENOUS | Status: DC
  Administered 2018-08-02: 23:00:00 via INTRAVENOUS

## 2018-08-02 MED ORDER — SODIUM CHLORIDE 0.9 % IV SOLN
1.0000 g | Freq: Once | INTRAVENOUS | Status: AC
  Administered 2018-08-02: 1 g via INTRAVENOUS
  Filled 2018-08-02: qty 10

## 2018-08-02 MED ORDER — VANCOMYCIN HCL IN DEXTROSE 1-5 GM/200ML-% IV SOLN
1000.0000 mg | Freq: Once | INTRAVENOUS | Status: AC
  Administered 2018-08-02: 1000 mg via INTRAVENOUS
  Filled 2018-08-02: qty 200

## 2018-08-02 MED ORDER — CLINDAMYCIN PHOSPHATE 900 MG/50ML IV SOLN
900.0000 mg | Freq: Once | INTRAVENOUS | Status: AC
  Administered 2018-08-02: 900 mg via INTRAVENOUS
  Filled 2018-08-02: qty 50

## 2018-08-02 MED ORDER — OXYCODONE HCL 5 MG PO TABS: 5.0000 mg | ORAL_TABLET | ORAL | Status: DC | PRN

## 2018-08-02 NOTE — ED Triage Notes (Addendum)
Pt to triage via wheelchair.  Pt has infected left knee.  Pt taking bactrim now.  Left knee red and has a small abscess to knee.  Painful to ambulate. Pt took tylenol  At noon today.

## 2018-08-02 NOTE — ED Provider Notes (Signed)
[facility] Emergency Department Provider Note    First MD Initiated Contact with Patient 08/02/18 1732     (approximate)  I have reviewed the triage vital signs and the nursing notes.   HISTORY  Chief Complaint Knee Pain    HPI Jacqueline Rush is a 57 y.o. female fairly healthy young lady who presents to the ER with left knee pain.  States that she injured it while kneeling down on were met at the gym on Saturday.  States that she felt like a sudden sting almost like something had bit her.  She brushed it off and then yesterday and the day before started noticing redness almost like a blister that she thought was a spider bite.  She went to her PCPs office who started her on Bactrim for cellulitis.  There is no obvious abscess for I&D at that point.  She went home and tolerated the Bactrim well but over the past 12 to 16 hours felt that the redness and swelling and pain became significantly more severe.  She is having difficulty moving the knee but has not noticed any swelling above the kneecap.  No history of diabetes.    Past Medical History:  Diagnosis Date  . Allergy   . Arthritis   . Family history of adverse reaction to anesthesia    dad had hiccups for 10 days after surgery  . Pre-diabetes   . Varicose veins    Family History  Problem Relation Age of Onset  . Cancer Father        colon, prostate, skin  . Diabetes Father   . Colon cancer Father   . Prostate cancer Father   . Ulcerative colitis Daughter   . Hypertension Mother   . Heart disease Mother        heart attack in 2017  . Cancer Maternal Uncle        prostate and colon  . Colon cancer Maternal Uncle   . Prostate cancer Maternal Uncle   . Stroke Maternal Grandmother   . Heart disease Maternal Grandfather        died from a heart attack  . Cancer Paternal Grandfather        colon  . Colon cancer Paternal Grandfather    Past Surgical History:  Procedure Laterality Date  .  BREAST CYST ASPIRATION Left   . CESAREAN SECTION    . EXTERNAL EAR SURGERY     pt states had right ear surgery  . KNEE ARTHROSCOPY WITH MEDIAL MENISECTOMY Right 06/01/2016   Procedure: Arthroscopic partial medial meniscectomy, arthroscopic abrasion chondroplasty of femoral trochlea, and debridement of symptomatic plica, right knee.;  Surgeon: Corky Mull, MD;  Location: [place];  Service: Orthopedics;  Laterality: Right;  . SHOULDER ARTHROSCOPY WITH DEBRIDEMENT AND BICEP TENDON REPAIR Left 06/30/2016   Procedure: SHOULDER ARTHROSCOPY WITH DEBRIDEMENT AND BICEP TENDON REPAIR;  Surgeon: Corky Mull, MD;  Location: [facility];  Service: Orthopedics;  Laterality: Left;  . SHOULDER ARTHROSCOPY WITH LABRAL REPAIR Left 06/30/2016   Procedure: SHOULDER ARTHROSCOPY WITH LABRAL REPAIR;  Surgeon: Corky Mull, MD;  Location: [facility];  Service: Orthopedics;  Laterality: Left;   Patient Active Problem List   Diagnosis Date Noted  . Sepsis due to cellulitis ([place]) 08/02/2018  . Seborrheic keratosis 02/27/2018  . Breast cancer screening 01/24/2017  . Post-traumatic osteoarthritis of left shoulder 07/01/2016  . Primary osteoarthritis of right knee 05/27/2016  . Complex tear of medial meniscus  of right knee as current injury 05/16/2016  . Rotator cuff tendinitis, left 05/16/2016  . Tear of left glenoid labrum 05/16/2016  . Annual physical exam 12/09/2015  . Encounter for screening mammogram for malignant neoplasm of breast 12/09/2015  . Skin lesion 12/09/2015  . Plantar warts 12/09/2015  . Need for immunization against influenza 12/09/2015  . Arthritis, multiple joint involvement 12/01/2015  . Varicose vein 12/01/2015  . Allergic rhinitis 12/01/2015  . Prediabetes 12/01/2015      Prior to Admission medications   Medication Sig Start Date End Date Taking? Authorizing Provider  acetaminophen (TYLENOL) 500 MG tablet Take 500 mg by mouth every 6 (six) hours as needed for mild pain or moderate  pain.   Yes [provider]  sulfamethoxazole-trimethoprim (BACTRIM DS,SEPTRA DS) 800-160 MG tablet Take 1 tablet by mouth 2 (two) times daily for 7 days. 08/01/18 08/08/18 Yes Hubbard Hartshorn, FNP  metroNIDAZOLE (FLAGYL) 500 MG tablet Take 1 tablet (500 mg total) by mouth 2 (two) times daily. No alcohol or cold medicines that contain alcohol while on this med Patient not taking: Reported on 08/01/2018 02/28/18   Arnetha Courser, MD    Allergies Iodinated diagnostic agents and Codeine    Social History Social History   Tobacco Use  . Smoking status: Never Smoker  . Smokeless tobacco: Never Used  Substance Use Topics  . Alcohol use: Yes    Alcohol/week: 0.0 standard drinks    Comment: occasional  . Drug use: No    Review of Systems Patient denies headaches, rhinorrhea, blurry vision, numbness, shortness of breath, chest pain, edema, cough, abdominal pain, nausea, vomiting, diarrhea, dysuria, fevers, rashes or hallucinations unless otherwise stated above in HPI. ____________________________________________   PHYSICAL EXAM:  VITAL SIGNS: Vitals:   08/02/18 2030 08/02/18 2204  BP: 121/80 119/71  Pulse: (!) 108 96  Resp: (!) 24 18  Temp:  97.6 F (36.4 C)  SpO2: 96% 100%    Constitutional: Alert and oriented.  Eyes: Conjunctivae are normal.  Head: Atraumatic. Nose: No congestion/rhinnorhea. Mouth/Throat: Mucous membranes are moist.   Neck: No stridor. Painless ROM.  Cardiovascular: Normal rate, regular rhythm. Grossly normal heart sounds.  Good peripheral circulation. Respiratory: Normal respiratory effort.  No retractions. Lungs CTAB. Gastrointestinal: Soft and nontender. No distention. No abdominal bruits. No CVA tenderness. Genitourinary:  Musculoskeletal: Kniffen swelling and erythema to the left prepatellar area.  Just in the inferior most aspect of the left patella there is a small pustule.  There is surrounding bruising and erythema extending 10 to 12 cm in  radius.  She does have roughly 20 degrees of passive and active range of motion of the knee.  No blistering..  No joint effusions. Neurologic:  Normal speech and language. No gross focal neurologic deficits are appreciated. No facial droop Skin:  Skin is warm, dry and intact. No rash noted. Psychiatric: Mood and affect are normal. Speech and behavior are normal.  ____________________________________________   LABS (all labs ordered are listed, but only abnormal results are displayed)  Results for orders placed or performed during the hospital encounter of 08/02/18 (from the past 24 hour(s))  CBC with Differential/Platelet     Status: Abnormal   Collection Time: 08/02/18  5:54 PM  Result Value Ref Range   WBC 12.8 (H) 3.6 - 11.0 K/uL   RBC 4.23 3.80 - 5.20 MIL/uL   Hemoglobin 12.6 12.0 - 16.0 g/dL   HCT 38.1 35.0 - 47.0 %   MCV 90.2 80.0 - 100.0  fL   MCH 29.9 26.0 - 34.0 pg   MCHC 33.1 32.0 - 36.0 g/dL   RDW 14.1 11.5 - 14.5 %   Platelets 231 150 - 440 K/uL   Neutrophils Relative % 84 %   Neutro Abs 10.8 (H) 1.4 - 6.5 K/uL   Lymphocytes Relative 7 %   Lymphs Abs 0.9 (L) 1.0 - 3.6 K/uL   Monocytes Relative 8 %   Monocytes Absolute 1.1 (H) 0.2 - 0.9 K/uL   Eosinophils Relative 1 %   Eosinophils Absolute 0.1 0 - 0.7 K/uL   Basophils Relative 0 %   Basophils Absolute 0.0 0 - 0.1 K/uL  Comprehensive metabolic panel     Status: Abnormal   Collection Time: 08/02/18  5:54 PM  Result Value Ref Range   Sodium 140 135 - 145 mmol/L   Potassium 4.0 3.5 - 5.1 mmol/L   Chloride 106 98 - 111 mmol/L   CO2 28 22 - 32 mmol/L   Glucose, Bld 135 (H) 70 - 99 mg/dL   BUN 11 6 - 20 mg/dL   Creatinine, Ser 0.96 0.44 - 1.00 mg/dL   Calcium 8.9 8.9 - 10.3 mg/dL   Total Protein 6.9 6.5 - 8.1 g/dL   Albumin 3.5 3.5 - 5.0 g/dL   AST 28 15 - 41 U/L   ALT 19 0 - 44 U/L   Alkaline Phosphatase 69 38 - 126 U/L   Total Bilirubin 0.4 0.3 - 1.2 mg/dL   GFR calc non Af Amer >60 >60 mL/min   GFR calc Af  Amer >60 >60 mL/min   Anion gap 6 5 - 15  Lactic acid, plasma     Status: None   Collection Time: 08/02/18  9:19 PM  Result Value Ref Range   Lactic Acid, Venous 1.3 0.5 - 1.9 mmol/L   ____________________________________________  ____________________________________________  RADIOLOGY  I personally reviewed all radiographic images ordered to evaluate for the above acute complaints and reviewed radiology reports and findings.  These findings were personally discussed with the patient.  Please see medical record for radiology report.  ____________________________________________   PROCEDURES  Procedure(s) performed:  Marland KitchenMarland KitchenIncision and Drainage Date/Time: 08/02/2018 11:56 PM Performed by: Merlyn Lot, MD Authorized by: Merlyn Lot, MD   Consent:    Consent obtained:  Verbal   Consent given by:  Patient   Risks discussed:  Bleeding, infection, incomplete drainage and pain   Alternatives discussed:  Alternative treatment, delayed treatment and observation Location:    Type:  Abscess   Location:  Lower extremity   Lower extremity location:  Knee   Knee location:  L knee Pre-procedure details:    Skin preparation:  Betadine Anesthesia (see MAR for exact dosages):    Anesthesia method:  Local infiltration   Local anesthetic:  Bupivacaine 0.5% w/o epi Procedure type:    Complexity:  Simple Procedure details:    Needle aspiration: yes     Incision types:  Stab incision   Scalpel blade:  11   Drainage:  Serosanguinous   Drainage amount:  Scant   Wound treatment:  Wound left open   Packing materials:  None Post-procedure details:    Patient tolerance of procedure:  Tolerated well, no immediate complications      Critical Care performed: no ____________________________________________   INITIAL IMPRESSION / ASSESSMENT AND PLAN / ED COURSE  Pertinent labs & imaging results that were available during my care of the patient were reviewed by me and considered  in my medical  decision making (see chart for details).   DDX: cellulitis, abscess, foreign body, septic arthritis  SHANITRA PHILLIPPI is a 57 y.o. who presents to the ED with chief complaint of left leg pain and swelling as described above.  Patient febrile otherwise normotensive.  Blood work will be sent for the above differential.  Bedside ultrasound shows small fluid collection in tracking area concerning for abscess.  Certainly area of cellulitis.  Does not seem clinically consistent with septic arthritis and given the surrounding cellulitis patient is not candidate for arthrocentesis at this point.  We have IV pain medication, IV antibiotics and IV fluids.  Clinical Course as of Aug 02 2356  Thu Aug 02, 2018  2030 Patient reassessed.  Still with tachycardia and low-grade temperature.  This does seem most clinically consistent with prepatellar cellulitis but based on her sirs criteria do believe patient should be admitted to the hospital.  We will touch base with orthopedics.  Will add on vancomycin.  Will discuss with hospitalist for admission.   [PR]    Clinical Course User Index [PR] Merlyn Lot, MD     As part of my medical decision making, I reviewed the following data within the [facility] notes reviewed and incorporated, Labs reviewed, notes from prior ED visits.   ____________________________________________   FINAL CLINICAL IMPRESSION(S) / ED DIAGNOSES  Final diagnoses:  Cellulitis of left knee      NEW MEDICATIONS STARTED DURING THIS VISIT:  Current Discharge Medication List       Note:  This document was prepared using Dragon voice recognition software and may include unintentional dictation errors.    Merlyn Lot, MD 08/02/18 [phone]

## 2018-08-02 NOTE — Progress Notes (Signed)
ANTIBIOTIC CONSULT NOTE - INITIAL  Pharmacy Consult for  Vancomycin  Indication: cellulitis  Allergies  Allergen Reactions  . Iodinated Diagnostic Agents Swelling    Contrast dye- facial swelling  . Codeine Other (See Comments)    Patient Measurements: Height: 5' (152.4 cm) Weight: 125 lb (56.7 kg) IBW/kg (Calculated) : 45.5 Adjusted Body Weight:   50 kg   Vital Signs: Temp: 100.8 F (38.2 C) (08/22 1839) Temp Source: Oral (08/22 1839) BP: 121/80 (08/22 2030) Pulse Rate: 108 (08/22 2030) Intake/Output from previous day: No intake/output data recorded. Intake/Output from this shift: No intake/output data recorded.  Labs: Recent Labs    08/02/18 1754  WBC 12.8*  HGB 12.6  PLT 231  CREATININE 0.96   Estimated Creatinine Clearance: 52.3 mL/min (by C-G formula based on SCr of 0.96 mg/dL). No results for input(s): VANCOTROUGH, VANCOPEAK, VANCORANDOM, GENTTROUGH, GENTPEAK, GENTRANDOM, TOBRATROUGH, TOBRAPEAK, TOBRARND, AMIKACINPEAK, AMIKACINTROU, AMIKACIN in the last 72 hours.   Microbiology: No results found for this or any previous visit (from the past 720 hour(s)).  Medical History: Past Medical History:  Diagnosis Date  . Allergy   . Arthritis   . Family history of adverse reaction to anesthesia    dad had hiccups for 10 days after surgery  . Pre-diabetes   . Varicose veins     Medications:   (Not in a hospital admission) Assessment: CrCl = 52.3 ml/min Ke = 0.048 hr-1 T1/2  = 14.4 hrs Vd = 35 L   Goal of Therapy:  Vancomycin trough level 10-15 mcg/ml  Plan:   Vancomycin 1 gm IV X 1 given in ED on 8/22 @ 21:00. Vancomycin 750 mg IV Q24H ordered to start 8/23 @ 0300 , ~ 6 hrs after 1st dose (stacked dosing). Pt will reach Css by 8/25 @ 21:00.  Will draw 1st trough on 8/26 @ 0230, which will be at Css.  Expected duration 7 days with resolution of temperature and/or normalization of WBC  Marypat Kimmet D 08/02/2018,9:41 PM

## 2018-08-02 NOTE — H&P (Addendum)
[facility] [place] NAME: Jacqueline Rush    MR#:  [id number]  DATE OF BIRTH:  [birth year]  DATE OF ADMISSION:  08/02/2018  PRIMARY CARE PHYSICIAN: Arnetha Courser, MD   REQUESTING/REFERRING PHYSICIAN: Merlyn Lot, MD  CHIEF COMPLAINT:   Chief Complaint  Patient presents with  . Knee Pain    HISTORY OF PRESENT ILLNESS:  Jacqueline Rush  is a 57 y.o. female with a known history of pre-diabetes who presented to the ED with left leg swelling, redness, and pain. Five days ago, she was kneeling on the gym floor when she felt a tiny prick on the front of her left knee. It felt like "a small rock or piece of glass". She didn't think anything of it. She then noticed swelling and redness of her knee the next day. The swelling and redness has been getting progressively worse. She went to see her PCP yesterday and was prescribed Bactrim. She took two doses. Her pain and swelling got significantly worse last night and this morning. She describes the pain as "throbbing and shooting". The pain does not radiate. She has been taking ibuprofen and tylenol as needed. She endorses fevers starting today and nausea. No chills.  PAST MEDICAL HISTORY:   Past Medical History:  Diagnosis Date  . Allergy   . Arthritis   . Family history of adverse reaction to anesthesia    dad had hiccups for 10 days after surgery  . Pre-diabetes   . Varicose veins     PAST SURGICAL HISTORY:   Past Surgical History:  Procedure Laterality Date  . BREAST CYST ASPIRATION Left   . CESAREAN SECTION    . EXTERNAL EAR SURGERY     pt states had right ear surgery  . KNEE ARTHROSCOPY WITH MEDIAL MENISECTOMY Right 06/01/2016   Procedure: Arthroscopic partial medial meniscectomy, arthroscopic abrasion chondroplasty of femoral trochlea, and debridement of symptomatic plica, right knee.;  Surgeon: Corky Mull, MD;  Location: [place];  Service: Orthopedics;   Laterality: Right;  . SHOULDER ARTHROSCOPY WITH DEBRIDEMENT AND BICEP TENDON REPAIR Left 06/30/2016   Procedure: SHOULDER ARTHROSCOPY WITH DEBRIDEMENT AND BICEP TENDON REPAIR;  Surgeon: Corky Mull, MD;  Location: [facility];  Service: Orthopedics;  Laterality: Left;  . SHOULDER ARTHROSCOPY WITH LABRAL REPAIR Left 06/30/2016   Procedure: SHOULDER ARTHROSCOPY WITH LABRAL REPAIR;  Surgeon: Corky Mull, MD;  Location: [facility];  Service: Orthopedics;  Laterality: Left;    SOCIAL HISTORY:   Social History   Tobacco Use  . Smoking status: Never Smoker  . Smokeless tobacco: Never Used  Substance Use Topics  . Alcohol use: Yes    Alcohol/week: 0.0 standard drinks    Comment: occasional    FAMILY HISTORY:   Family History  Problem Relation Age of Onset  . Cancer Father        colon, prostate, skin  . Diabetes Father   . Colon cancer Father   . Prostate cancer Father   . Ulcerative colitis Daughter   . Hypertension Mother   . Heart disease Mother        heart attack in 2017  . Cancer Maternal Uncle        prostate and colon  . Colon cancer Maternal Uncle   . Prostate cancer Maternal Uncle   . Stroke Maternal Grandmother   . Heart disease Maternal Grandfather        died from a heart attack  .  Cancer Paternal Grandfather        colon  . Colon cancer Paternal Grandfather     DRUG ALLERGIES:   Allergies  Allergen Reactions  . Iodinated Diagnostic Agents Swelling    Contrast dye- facial swelling  . Codeine Other (See Comments)    REVIEW OF SYSTEMS:   Review of Systems  Constitutional: Positive for fever. Negative for chills.  HENT: Negative for congestion and sore throat.   Eyes: Negative for blurred vision and double vision.  Respiratory: Negative for cough and shortness of breath.   Cardiovascular: Positive for leg swelling. Negative for chest pain and palpitations.  Gastrointestinal: Positive for nausea. Negative for abdominal pain and vomiting.  Genitourinary:  Negative for dysuria and frequency.  Musculoskeletal: Positive for joint pain. Negative for back pain.  Neurological: Negative for dizziness and headaches.    MEDICATIONS AT HOME:   Prior to Admission medications   Medication Sig Start Date End Date Taking? Authorizing Provider  acetaminophen (TYLENOL) 500 MG tablet Take 500 mg by mouth every 6 (six) hours as needed for mild pain or moderate pain.   Yes [provider]  sulfamethoxazole-trimethoprim (BACTRIM DS,SEPTRA DS) 800-160 MG tablet Take 1 tablet by mouth 2 (two) times daily for 7 days. 08/01/18 08/08/18 Yes Hubbard Hartshorn, FNP  metroNIDAZOLE (FLAGYL) 500 MG tablet Take 1 tablet (500 mg total) by mouth 2 (two) times daily. No alcohol or cold medicines that contain alcohol while on this med Patient not taking: Reported on 08/01/2018 02/28/18   Arnetha Courser, MD      VITAL SIGNS:  Blood pressure 121/80, pulse (!) 108, temperature (!) 100.8 F (38.2 C), temperature source Oral, resp. rate (!) 24, height 5' (1.524 m), weight 56.7 kg, SpO2 96 %.  PHYSICAL EXAMINATION:  Physical Exam  GENERAL:  57 y.o.-year-old patient lying in the bed with no acute distress.  EYES: Pupils equal, round, reactive to light and accommodation. No scleral icterus. Extraocular muscles intact.  HEENT: Head atraumatic, normocephalic. Oropharynx and nasopharynx clear. Flushed cheeks. NECK:  Supple, no jugular venous distention. No thyroid enlargement, no tenderness.  LUNGS: Normal breath sounds bilaterally, no wheezing, rales,rhonchi or crepitation. No use of accessory muscles of respiration.  CARDIOVASCULAR: Tachycardic, regular rhythm, S1, S2 normal. No murmurs, rubs, or gallops.  ABDOMEN: Soft, nontender, nondistended. Bowel sounds present. No organomegaly or mass.  EXTREMITIES: No pedal edema, cyanosis, or clubbing. +marked swelling, erythema, and warmth of the anterior right knee. NEUROLOGIC: Cranial nerves II through XII are intact. Muscle  strength 5/5 in all extremities. Sensation intact. Gait not checked.  PSYCHIATRIC: The patient is alert and oriented x 3.  SKIN: No obvious rash, lesion, or ulcer.   LABORATORY PANEL:   CBC Recent Labs  Lab 08/02/18 1754  WBC 12.8*  HGB 12.6  HCT 38.1  PLT 231   ------------------------------------------------------------------------------------------------------------------  Chemistries  Recent Labs  Lab 08/02/18 1754  NA 140  K 4.0  CL 106  CO2 28  GLUCOSE 135*  BUN 11  CREATININE 0.96  CALCIUM 8.9  AST 28  ALT 19  ALKPHOS 69  BILITOT 0.4   ------------------------------------------------------------------------------------------------------------------  Cardiac Enzymes No results for input(s): TROPONINI in the last 168 hours. ------------------------------------------------------------------------------------------------------------------  RADIOLOGY:  Dg Knee Complete 4 Views Left  Result Date: 08/02/2018 CLINICAL DATA:  Infected LEFT knee, on Bactrim, LEFT knee read with small abscess, painful to ambulate EXAM: LEFT KNEE - COMPLETE 4+ VIEW COMPARISON:  None FINDINGS: Scattered soft tissue swelling greatest anteriorly. Low normal osseous  mineralization. Joint spaces preserved. Small corticated ossicles at posterior intercondylar region. No acute fracture, dislocation, or bone destruction. No knee joint effusion. IMPRESSION: No acute osseous abnormalities. Soft tissue swelling greatest anteriorly. Electronically Signed   By: Lavonia Dana M.D.   On: 08/02/2018 18:06      IMPRESSION AND PLAN:   Sepsis secondary to left knee cellulitis- patient febrile to 102.5, tachycardic, and had an elevated WBC count on admission. Knee x-ray negative for effusion or osseous abnormality. Septic arthritis less likely without joint effusion. - ortho consulted by ED- will see in the morning - given clindamycin and ceftriaxone in the ED, will switch to vancomycin - check lactic acid  and blood cultures (although blood cultures drawn after antibiotics) - s/p 1L bolus in the ED, will give another bolus and then start MIVFs - tylenol and oxycodone for pain, zofran for nausea  Hyperglycemia with a history of prediabetes- glucose 135. - check A1c  All the records are reviewed and case discussed with ED provider. Management plans discussed with the patient, family and they are in agreement.  CODE STATUS: FULL  TOTAL TIME TAKING CARE OF THIS PATIENT: 35 minutes.    Berna Spare Malikai Gut M.D on 08/02/2018 at 9:06 PM  Between 7am to 6pm - Pager - [phone]  After 6pm go to [email or web address] - Proofreader  [facility]  Office  [phone]  CC: Primary care physician; Arnetha Courser, MD   Note: This dictation was prepared with Dragon dictation along with smaller phrase technology. Any transcriptional errors that result from this process are unintentional.

## 2018-08-02 NOTE — Telephone Encounter (Signed)
Patient was seen by E. Uvaldo Rising, NP yesterday. Dx cellulitis Left knee. Treated with Bactrim DS beginning last evening and one dose today as of now. Reports increased edema and erythema to the area along with a blister and possible exudate that has appeared. She has increased pain since yesterday Has been alternating tylenol and advil today, remains with low grade fever. Reports she has been elevating the leg today. Denies SOB/CP. Based on increased severity of symptoms and Emily's visit note, advised ER visit for further evaluation and treatment. Answer Assessment - Initial Assessment Questions 1. REASON FOR CALL or QUESTION: "What is your reason for calling today?" or "How can I best help you?" or "What question do you have that I can help answer?"Patient updating symptom information from previous visit with Raelyn Ensign, NP.  Protocols used: INFORMATION ONLY CALL-A-AH

## 2018-08-02 NOTE — ED Notes (Signed)
Pt c/o L/kee pain 10/10 when she tries to stand on that leg. Pt states " I kneeled down on something at the gym on Saturday" Pt noticed redness since  8/17. Pt states she noticed swelling/redeness/tenderness since yesterday. Upon assessment  This RN noticed swelling, redness. No drainage noted. NAD noted. ED provider at bedside.

## 2018-08-03 ENCOUNTER — Inpatient Hospital Stay: Payer: Self-pay

## 2018-08-03 LAB — BASIC METABOLIC PANEL
Anion gap: 6 (ref 5–15)
BUN: 10 mg/dL (ref 6–20)
CO2: 27 mmol/L (ref 22–32)
CREATININE: 0.98 mg/dL (ref 0.44–1.00)
Calcium: 8.1 mg/dL — ABNORMAL LOW (ref 8.9–10.3)
Chloride: 107 mmol/L (ref 98–111)
GFR calc Af Amer: >60 mL/min (ref >60)
GFR calc non Af Amer: >60 mL/min (ref >60)
Glucose, Bld: 130 mg/dL — ABNORMAL HIGH (ref 70–99)
Potassium: 4.1 mmol/L (ref 3.5–5.1)
SODIUM: 140 mmol/L (ref 135–145)

## 2018-08-03 LAB — CBC
HCT: 34.3 % — ABNORMAL LOW (ref 35.0–47.0)
Hemoglobin: 11.5 g/dL — ABNORMAL LOW (ref 12.0–16.0)
MCH: 30.1 pg (ref 26.0–34.0)
MCHC: 33.5 g/dL (ref 32.0–36.0)
MCV: 90 fL (ref 80.0–100.0)
PLATELETS: 194 10*3/uL (ref 150–440)
RBC: 3.81 MIL/uL (ref 3.80–5.20)
RDW: 13.9 % (ref 11.5–14.5)
WBC: 14.4 10*3/uL — AB (ref 3.6–11.0)

## 2018-08-03 LAB — HEMOGLOBIN A1C
Hgb A1c MFr Bld: 5.4 % (ref 4.8–5.6)
MEAN PLASMA GLUCOSE: 108.28 mg/dL

## 2018-08-03 MED ORDER — ALBUTEROL SULFATE (2.5 MG/3ML) 0.083% IN NEBU
2.5000 mg | INHALATION_SOLUTION | Freq: Four times a day (QID) | RESPIRATORY_TRACT | Status: DC
  Administered 2018-08-03 (×2): 2.5 mg via RESPIRATORY_TRACT
  Filled 2018-08-03 (×2): qty 3

## 2018-08-03 MED ORDER — OXYCODONE HCL 5 MG PO TABS
5.0000 mg | ORAL_TABLET | ORAL | Status: DC | PRN
  Administered 2018-08-03 – 2018-08-07 (×3): 5 mg via ORAL
  Filled 2018-08-03 (×3): qty 1

## 2018-08-03 MED ORDER — SODIUM CHLORIDE 0.9 % IV SOLN
1.0000 g | INTRAVENOUS | Status: DC
  Administered 2018-08-03 – 2018-08-06 (×4): 1 g via INTRAVENOUS
  Filled 2018-08-03 (×2): qty 1
  Filled 2018-08-03 (×2): qty 10
  Filled 2018-08-03: qty 1

## 2018-08-03 MED ORDER — METHYLPREDNISOLONE SODIUM SUCC 40 MG IJ SOLR
40.0000 mg | Freq: Every day | INTRAMUSCULAR | Status: DC
  Administered 2018-08-03 – 2018-08-07 (×5): 40 mg via INTRAVENOUS
  Filled 2018-08-03 (×5): qty 1

## 2018-08-03 NOTE — Consult Note (Addendum)
Pharmacy Antibiotic Note  Jacqueline Rush is a 57 y.o. female admitted on 08/02/2018 with cellulitis.  Pharmacy has been consulted for Vancomycin dosing.   Assessment: CrCl = 51.2 ml/min Ke = 0.047 hr-1 T1/2  = 14.7 hrs Vd = 39.7 L   Goal of Therapy:  Vancomycin trough level 10-15 mcg/ml  Plan:   Continue Vancomycin 750 mg IV Q24H.  Will draw 1st trough on 8/26 @ 0230  Will continue to monitor.  Height: 5' (152.4 cm) Weight: 125 lb (56.7 kg) IBW/kg (Calculated) : 45.5  Temp (24hrs), Avg:100 F (37.8 C), Min:97.6 F (36.4 C), Max:102.5 F (39.2 C)  Recent Labs  Lab 08/02/18 1754 08/02/18 2119 08/03/18 0520  WBC 12.8*  --  14.4*  CREATININE 0.96  --  0.98  LATICACIDVEN  --  1.3  --     Estimated Creatinine Clearance: 51.2 mL/min (by C-G formula based on SCr of 0.98 mg/dL).    Allergies  Allergen Reactions  . Iodinated Diagnostic Agents Swelling    Contrast dye- facial swelling  . Codeine Other (See Comments)    Antimicrobials this admission: 0822 Ceftriaxone >>  0822 Clindamycin x 1 0822 Vancomycin >>   Microbiology results: 0822 BCx: NGTD   Thank you for allowing pharmacy to be a part of this patient's care.   Paticia Stack, PharmD Pharmacy Resident  08/03/2018 9:39 AM

## 2018-08-03 NOTE — Progress Notes (Signed)
Patient ID: Jacqueline Rush, female   DOB: 1961-03-16, 57 y.o.   MRN: [id number]  [facility] PROGRESS NOTE  Jacqueline Rush JME:[id number] DOB: 06/15/1961 DOA: 08/02/2018 PCP: Arnetha Courser, MD  HPI/Subjective: Patient coming in with left knee pain and unable to bear weight and swelling and redness.  Been going on for couple days.  Started off as a little spot and got bigger.  Patient also has slight wheeze.  Objective: Vitals:   08/03/18 0745 08/03/18 1135  BP: 109/68   Pulse: 86   Resp: 16   Temp: 99.8 F (37.7 C) 99 F (37.2 C)  SpO2: 100%     Filed Weights   08/02/18 1727  Weight: 56.7 kg    ROS: Review of Systems  Constitutional: Negative for chills and fever.  Eyes: Negative for blurred vision.  Respiratory: Negative for cough and shortness of breath.   Cardiovascular: Negative for chest pain.  Gastrointestinal: Negative for abdominal pain, constipation, diarrhea, nausea and vomiting.  Genitourinary: Negative for dysuria.  Musculoskeletal: Positive for joint pain.  Neurological: Negative for dizziness and headaches.   Exam: Physical Exam  Constitutional: She is oriented to person, place, and time.  HENT:  Nose: No mucosal edema.  Mouth/Throat: No oropharyngeal exudate or posterior oropharyngeal edema.  Eyes: Pupils are equal, round, and reactive to light. Conjunctivae, EOM and lids are normal.  Neck: No JVD present. Carotid bruit is not present. No edema present. No thyroid mass and no thyromegaly present.  Cardiovascular: S1 normal and S2 normal. Exam reveals no gallop.  No murmur heard. Pulses:      Dorsalis pedis pulses are 2+ on the right side, and 2+ on the left side.  Respiratory: No respiratory distress. She has decreased breath sounds in the right lower field and the left lower field. She has wheezes in the right middle field, the right lower field, the left middle field and the left lower field. She has no rhonchi. She has no rales.  GI: Soft.  Bowel sounds are normal. There is no tenderness.  Musculoskeletal:       Left knee: She exhibits decreased range of motion and swelling.       Right ankle: She exhibits no swelling.       Left ankle: She exhibits no swelling.  Lymphadenopathy:    She has no cervical adenopathy.  Neurological: She is alert and oriented to person, place, and time. No cranial nerve deficit.  Skin: Skin is warm. Nails show no clubbing.  Scab seen over the left knee area with surrounding erythema and warmth.  Psychiatric: She has a normal mood and affect.      Data Reviewed: Basic Metabolic Panel: Recent Labs  Lab 08/02/18 1754 08/03/18 0520  NA 140 140  K 4.0 4.1  CL 106 107  CO2 28 27  GLUCOSE 135* 130*  BUN 11 10  CREATININE 0.96 0.98  CALCIUM 8.9 8.1*   Liver Function Tests: Recent Labs  Lab 08/02/18 1754  AST 28  ALT 19  ALKPHOS 69  BILITOT 0.4  PROT 6.9  ALBUMIN 3.5   CBC: Recent Labs  Lab 08/02/18 1754 08/03/18 0520  WBC 12.8* 14.4*  NEUTROABS 10.8*  --   HGB 12.6 11.5*  HCT 38.1 34.3*  MCV 90.2 90.0  PLT 231 194     Recent Results (from the past 240 hour(s))  CULTURE, BLOOD (ROUTINE X 2) w Reflex to ID Panel     Status: None (Preliminary result)  Collection Time: 08/02/18  9:07 PM  Result Value Ref Range Status   Specimen Description BLOOD BLOOD RIGHT HAND  Final   Special Requests   Final    BOTTLES DRAWN AEROBIC AND ANAEROBIC Blood Culture results may not be optimal due to an inadequate volume of blood received in culture bottles   Culture   Final    NO GROWTH < 12 HOURS Performed at [facility], [address] [place], [place] [address]    Report Status PENDING  Incomplete  CULTURE, BLOOD (ROUTINE X 2) w Reflex to ID Panel     Status: None (Preliminary result)   Collection Time: 08/02/18  9:19 PM  Result Value Ref Range Status   Specimen Description BLOOD RIGHT ANTECUBITAL  Final   Special Requests   Final    BOTTLES DRAWN AEROBIC AND  ANAEROBIC Blood Culture results may not be optimal due to an inadequate volume of blood received in culture bottles   Culture   Final    NO GROWTH < 12 HOURS Performed at [facility], [address] [place], [place] [address]    Report Status PENDING  Incomplete     Studies: Dg Knee Complete 4 Views Left  Result Date: 08/02/2018 CLINICAL DATA:  Infected LEFT knee, on Bactrim, LEFT knee read with small abscess, painful to ambulate EXAM: LEFT KNEE - COMPLETE 4+ VIEW COMPARISON:  None FINDINGS: Scattered soft tissue swelling greatest anteriorly. Low normal osseous mineralization. Joint spaces preserved. Small corticated ossicles at posterior intercondylar region. No acute fracture, dislocation, or bone destruction. No knee joint effusion. IMPRESSION: No acute osseous abnormalities. Soft tissue swelling greatest anteriorly. Electronically Signed   By: Lavonia Dana M.D.   On: 08/02/2018 18:06    Scheduled Meds: . albuterol  2.5 mg Nebulization Q6H  . enoxaparin (LOVENOX) injection  40 mg Subcutaneous Q24H  . methylPREDNISolone (SOLU-MEDROL) injection  40 mg Intravenous Daily   Continuous Infusions: . sodium chloride 50 mL/hr at 08/03/18 1037  . cefTRIAXone (ROCEPHIN)  IV    . vancomycin Stopped (08/03/18 0300)    Assessment/Plan:  1. Clinical sepsis with infected prepatellar bursitis and left knee cellulitis.  Empiric antibiotics with Rocephin and vancomycin.  Appreciate orthopedic consultation.  Add Solu-Medrol.  Pain control with oral oxycodone 2. Wheeze.  Chest x-ray.  Empiric nebulizer treatments. 3. Impaired fasting glucose.  Patient not a diabetic at this time.  A1c only 5.4.  Code Status:     Code Status Orders  (From admission, onward)         Start     Ordered   08/02/18 2217  Full code  Continuous     08/02/18 2216        Code Status History    Date Active Date Inactive Code Status Order ID Comments User Context   06/01/2016 1449 06/01/2016 1751 Full Code  [id number]  Poggi, Marshall Cork, MD Inpatient     Disposition Plan: Likely will need a few days of IV antibiotics  Consultants:  Orthopedic surgery  Antibiotics:  Rocephin  Vancomycin  Time spent: 28 minutes  [place]

## 2018-08-03 NOTE — Plan of Care (Signed)
  Problem: Education: Goal: Knowledge of General Education information will improve Description Including pain rating scale, medication(s)/side effects and non-pharmacologic comfort measures Outcome: Progressing   Problem: Health Behavior/Discharge Planning: Goal: Ability to manage health-related needs will improve Outcome: Progressing   Problem: Clinical Measurements: Goal: Ability to avoid or minimize complications of infection will improve Outcome: Progressing   Problem: Skin Integrity: Goal: Skin integrity will improve Outcome: Progressing

## 2018-08-03 NOTE — Consult Note (Signed)
ORTHOPAEDICS: Patient interviewed and evaluated. Cellulitis of the left knee; appears superficial at this time  Agree with antibiotic coverage. No indication for surgical intervention at this time. Polar Care to the left knee. Will reevaluate later today. Full note to follow.  James P. Holley Bouche M.D.

## 2018-08-03 NOTE — Telephone Encounter (Signed)
Documentation reviewed, and am in agreement with plan. Pt was admitted at [facility].

## 2018-08-04 LAB — BASIC METABOLIC PANEL
Anion gap: 5 (ref 5–15)
BUN: 10 mg/dL (ref 6–20)
CHLORIDE: 110 mmol/L (ref 98–111)
CO2: 26 mmol/L (ref 22–32)
CREATININE: 0.58 mg/dL (ref 0.44–1.00)
Calcium: 8.8 mg/dL — ABNORMAL LOW (ref 8.9–10.3)
GFR calc non Af Amer: >60 mL/min (ref >60)
GLUCOSE: 140 mg/dL — AB (ref 70–99)
Potassium: 4 mmol/L (ref 3.5–5.1)
Sodium: 141 mmol/L (ref 135–145)

## 2018-08-04 MED ORDER — MOMETASONE FURO-FORMOTEROL FUM 200-5 MCG/ACT IN AERO
2.0000 | INHALATION_SPRAY | Freq: Two times a day (BID) | RESPIRATORY_TRACT | Status: DC
  Administered 2018-08-04: 23:00:00 2 via RESPIRATORY_TRACT
  Filled 2018-08-04: qty 8.8

## 2018-08-04 MED ORDER — BENZONATATE 100 MG PO CAPS
200.0000 mg | ORAL_CAPSULE | Freq: Three times a day (TID) | ORAL | Status: DC
  Administered 2018-08-04 – 2018-08-07 (×9): 200 mg via ORAL
  Filled 2018-08-04 (×9): qty 2

## 2018-08-04 MED ORDER — ALBUTEROL SULFATE (2.5 MG/3ML) 0.083% IN NEBU: 2.5000 mg | INHALATION_SOLUTION | Freq: Four times a day (QID) | RESPIRATORY_TRACT | Status: DC | PRN

## 2018-08-04 NOTE — Plan of Care (Signed)
  Problem: Education: Goal: Knowledge of General Education information will improve Description Including pain rating scale, medication(s)/side effects and non-pharmacologic comfort measures Outcome: Progressing   Problem: Health Behavior/Discharge Planning: Goal: Ability to manage health-related needs will improve Outcome: Progressing   Problem: Clinical Measurements: Goal: Ability to avoid or minimize complications of infection will improve Outcome: Progressing   Problem: Skin Integrity: Goal: Skin integrity will improve Outcome: Progressing

## 2018-08-04 NOTE — Progress Notes (Addendum)
[place] at [place] NAME: Jacqueline Rush    MR#:  [id number]  DATE OF BIRTH:  Feb 24, 1961  SUBJECTIVE:  CHIEF COMPLAINT:   Chief Complaint  Patient presents with  . Knee Pain   -Still has left knee swelling and pain.  REVIEW OF SYSTEMS:  Review of Systems  Constitutional: Negative for chills, fever and malaise/fatigue.  HENT: Negative for congestion, ear discharge, hearing loss and nosebleeds.   Eyes: Negative for blurred vision and double vision.  Respiratory: Negative for cough, shortness of breath and wheezing.   Cardiovascular: Negative for chest pain, palpitations and leg swelling.  Gastrointestinal: Negative for abdominal pain, constipation, diarrhea, nausea and vomiting.  Genitourinary: Negative for dysuria.  Musculoskeletal: Positive for joint pain and myalgias.  Neurological: Negative for dizziness, focal weakness, seizures, weakness and headaches.  Psychiatric/Behavioral: Negative for depression.    DRUG ALLERGIES:   Allergies  Allergen Reactions  . Iodinated Diagnostic Agents Swelling    Contrast dye- facial swelling  . Codeine Other (See Comments)    VITALS:  Blood pressure 125/73, pulse 69, temperature 98.2 F (36.8 C), temperature source Oral, resp. rate 16, height 5' (1.524 m), weight 56.7 kg, SpO2 99 %.  PHYSICAL EXAMINATION:  Physical Exam  GENERAL:  57 y.o.-year-old patient lying in the bed with no acute distress.  EYES: Pupils equal, round, reactive to light and accommodation. No scleral icterus. Extraocular muscles intact.  HEENT: Head atraumatic, normocephalic. Oropharynx and nasopharynx clear.  NECK:  Supple, no jugular venous distention. No thyroid enlargement, no tenderness.  LUNGS: Normal breath sounds bilaterally, no wheezing, rales,rhonchi or crepitation. No use of accessory muscles of respiration.  CARDIOVASCULAR: S1, S2 normal. No murmurs, rubs, or gallops.  ABDOMEN: Soft, nontender,  nondistended. Bowel sounds present. No organomegaly or mass.  EXTREMITIES: Left knee anteriorly significant erythema and swelling - no fluctuant portion.  No pedal edema, cyanosis, or clubbing.  NEUROLOGIC: Cranial nerves II through XII are intact. Muscle strength 5/5 in all extremities. Sensation intact. Gait not checked.  PSYCHIATRIC: The patient is alert and oriented x 3.  SKIN: No obvious rash, lesion, or ulcer.    LABORATORY PANEL:   CBC Recent Labs  Lab 08/03/18 0520  WBC 14.4*  HGB 11.5*  HCT 34.3*  PLT 194   ------------------------------------------------------------------------------------------------------------------  Chemistries  Recent Labs  Lab 08/02/18 1754  08/04/18 0441  NA 140   < > 141  K 4.0   < > 4.0  CL 106   < > 110  CO2 28   < > 26  GLUCOSE 135*   < > 140*  BUN 11   < > 10  CREATININE 0.96   < > 0.58  CALCIUM 8.9   < > 8.8*  AST 28  --   --   ALT 19  --   --   ALKPHOS 69  --   --   BILITOT 0.4  --   --    < > = values in this interval not displayed.   ------------------------------------------------------------------------------------------------------------------  Cardiac Enzymes No results for input(s): TROPONINI in the last 168 hours. ------------------------------------------------------------------------------------------------------------------  RADIOLOGY:  Dg Chest 2 View  Result Date: 08/03/2018 CLINICAL DATA:  Wheezing. EXAM: CHEST - 2 VIEW COMPARISON:  Radiograph of September 08, 2006. FINDINGS: The heart size and mediastinal contours are within normal limits. Both lungs are clear. No pneumothorax or pleural effusion is noted. The visualized skeletal structures are unremarkable. IMPRESSION: No active cardiopulmonary disease. Electronically Signed  By: Marijo Conception, M.D.   On: 08/03/2018 15:08   Dg Knee Complete 4 Views Left  Result Date: 08/02/2018 CLINICAL DATA:  Infected LEFT knee, on Bactrim, LEFT knee read with small  abscess, painful to ambulate EXAM: LEFT KNEE - COMPLETE 4+ VIEW COMPARISON:  None FINDINGS: Scattered soft tissue swelling greatest anteriorly. Low normal osseous mineralization. Joint spaces preserved. Small corticated ossicles at posterior intercondylar region. No acute fracture, dislocation, or bone destruction. No knee joint effusion. IMPRESSION: No acute osseous abnormalities. Soft tissue swelling greatest anteriorly. Electronically Signed   By: Lavonia Dana M.D.   On: 08/02/2018 18:06    EKG:   Orders placed or performed in visit on 09/08/06  . 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  1.  Left knee cellulitis-started after a possible sharp injury or insect bite. -Significant cellulitis, appreciate orthopedics consult.  No indication for drainage -Continue vancomycin and Rocephin with slow improvement noted. -Ice pack.  Activity as tolerated. -We will need IV antibiotics for another day or 2 -Follow-up CBC in a.m.  Continue steroids.  2.  Cough-change neb treatments to inhaler.  No history of wheezing.  Could be reactive. -Chest x-ray is clear.  Continue steroids. -Tessalon Perles added  3.  DVT prophylaxis-on Lovenox     All the records are reviewed and case discussed with Care Management/Social Workerr. Management plans discussed with the patient, family and they are in agreement.  CODE STATUS: Full Code  TOTAL TIME TAKING CARE OF THIS PATIENT: 39 minutes.   POSSIBLE D/C IN 2 DAYS, DEPENDING ON CLINICAL CONDITION.   Gladstone Lighter M.D on 08/04/2018 at 1:48 PM  Between 7am to 6pm - Pager - [phone]  After 6pm go to [email or web address] - password [email or web address] [facility]  Office  [phone]  CC: Primary care physician; Arnetha Courser, MD

## 2018-08-04 NOTE — Consult Note (Signed)
ORTHOPAEDICS: Patient reports less tenderness. Infrequent dry cough.  Exam: WDWN female in no apparent discomfort.  Lungs: CTA. No wheezes.  Left LE: No knee effusion. Gentle knee ROM well tolerated. Still with erythema to anterior aspect of knee, but somewhat improved. Less soft tissue swelling (skin wrinkles now apparent.) No palpable fluctuance to the prepatellar bursa.  Labs: Blood cultures negative at 2 days. No recent CBC.  Impression: LLE cellulitis  Plan: Continue with antibiotics and steroids as per Medicine. Continue with Polar Care to left knee. Will add CBC for AM.  Laurice Record. Holley Bouche M.D.

## 2018-08-05 LAB — CBC WITH DIFFERENTIAL/PLATELET
BASOS ABS: 0 10*3/uL (ref 0–0.1)
Basophils Relative: 0 %
EOS PCT: 1 %
Eosinophils Absolute: 0.1 10*3/uL (ref 0–0.7)
HEMATOCRIT: 35.1 % (ref 35.0–47.0)
Hemoglobin: 11.9 g/dL — ABNORMAL LOW (ref 12.0–16.0)
LYMPHS ABS: 1.7 10*3/uL (ref 1.0–3.6)
LYMPHS PCT: 15 %
MCH: 30.2 pg (ref 26.0–34.0)
MCHC: 33.8 g/dL (ref 32.0–36.0)
MCV: 89.1 fL (ref 80.0–100.0)
Monocytes Absolute: 0.9 10*3/uL (ref 0.2–0.9)
Monocytes Relative: 8 %
NEUTROS ABS: 9 10*3/uL — AB (ref 1.4–6.5)
Neutrophils Relative %: 76 %
PLATELETS: 273 10*3/uL (ref 150–440)
RBC: 3.93 MIL/uL (ref 3.80–5.20)
RDW: 13.8 % (ref 11.5–14.5)
WBC: 11.8 10*3/uL — ABNORMAL HIGH (ref 3.6–11.0)

## 2018-08-05 MED ORDER — MOMETASONE FURO-FORMOTEROL FUM 200-5 MCG/ACT IN AERO
2.0000 | INHALATION_SPRAY | Freq: Two times a day (BID) | RESPIRATORY_TRACT | Status: DC
  Administered 2018-08-06 – 2018-08-07 (×3): 2 via RESPIRATORY_TRACT
  Filled 2018-08-05: qty 8.8

## 2018-08-05 MED ORDER — ALBUTEROL SULFATE (2.5 MG/3ML) 0.083% IN NEBU
3.0000 mL | INHALATION_SOLUTION | Freq: Four times a day (QID) | RESPIRATORY_TRACT | Status: DC | PRN
  Filled 2018-08-05: qty 3

## 2018-08-05 NOTE — Progress Notes (Signed)
[place] at [place] NAME: Irlene Crudup    MR#:  [id number]  DATE OF BIRTH:  1961/12/12  SUBJECTIVE:  CHIEF COMPLAINT:   Chief Complaint  Patient presents with  . Knee Pain   -Swelling and redness are improving, though more concentrated anteriorly now.  Patient complains of more pain, however increased range of motion and more active today.  REVIEW OF SYSTEMS:  Review of Systems  Constitutional: Negative for chills, fever and malaise/fatigue.  HENT: Negative for congestion, ear discharge, hearing loss and nosebleeds.   Eyes: Negative for blurred vision and double vision.  Respiratory: Negative for cough, shortness of breath and wheezing.   Cardiovascular: Negative for chest pain, palpitations and leg swelling.  Gastrointestinal: Negative for abdominal pain, constipation, diarrhea, nausea and vomiting.  Genitourinary: Negative for dysuria.  Musculoskeletal: Positive for joint pain and myalgias.  Neurological: Negative for dizziness, focal weakness, seizures, weakness and headaches.  Psychiatric/Behavioral: Negative for depression.    DRUG ALLERGIES:   Allergies  Allergen Reactions  . Iodinated Diagnostic Agents Swelling    Contrast dye- facial swelling  . Codeine Other (See Comments)    VITALS:  Blood pressure (!) 136/92, pulse 72, temperature 98.3 F (36.8 C), temperature source Oral, resp. rate 16, height 5' (1.524 m), weight 56.7 kg, SpO2 96 %.  PHYSICAL EXAMINATION:  Physical Exam  GENERAL:  57 y.o.-year-old patient lying in the bed with no acute distress.  EYES: Pupils equal, round, reactive to light and accommodation. No scleral icterus. Extraocular muscles intact.  HEENT: Head atraumatic, normocephalic. Oropharynx and nasopharynx clear.  NECK:  Supple, no jugular venous distention. No thyroid enlargement, no tenderness.  LUNGS: Normal breath sounds bilaterally, no wheezing, rales,rhonchi or crepitation. No use of  accessory muscles of respiration.  CARDIOVASCULAR: S1, S2 normal. No murmurs, rubs, or gallops.  ABDOMEN: Soft, nontender, nondistended. Bowel sounds present. No organomegaly or mass.  EXTREMITIES: Left knee anteriorly significant erythema and swelling - no fluctuant portion.  No pedal edema, cyanosis, or clubbing.  NEUROLOGIC: Cranial nerves II through XII are intact. Muscle strength 5/5 in all extremities. Sensation intact. Gait not checked.  PSYCHIATRIC: The patient is alert and oriented x 3.  SKIN: No obvious rash, lesion, or ulcer.    LABORATORY PANEL:   CBC Recent Labs  Lab 08/05/18 0514  WBC 11.8*  HGB 11.9*  HCT 35.1  PLT 273   ------------------------------------------------------------------------------------------------------------------  Chemistries  Recent Labs  Lab 08/02/18 1754  08/04/18 0441  NA 140   < > 141  K 4.0   < > 4.0  CL 106   < > 110  CO2 28   < > 26  GLUCOSE 135*   < > 140*  BUN 11   < > 10  CREATININE 0.96   < > 0.58  CALCIUM 8.9   < > 8.8*  AST 28  --   --   ALT 19  --   --   ALKPHOS 69  --   --   BILITOT 0.4  --   --    < > = values in this interval not displayed.   ------------------------------------------------------------------------------------------------------------------  Cardiac Enzymes No results for input(s): TROPONINI in the last 168 hours. ------------------------------------------------------------------------------------------------------------------  RADIOLOGY:  Dg Chest 2 View  Result Date: 08/03/2018 CLINICAL DATA:  Wheezing. EXAM: CHEST - 2 VIEW COMPARISON:  Radiograph of September 08, 2006. FINDINGS: The heart size and mediastinal contours are within normal limits. Both lungs are clear. No  pneumothorax or pleural effusion is noted. The visualized skeletal structures are unremarkable. IMPRESSION: No active cardiopulmonary disease. Electronically Signed   By: Marijo Conception, M.D.   On: 08/03/2018 15:08    EKG:    Orders placed or performed in visit on 09/08/06  . 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  1.  Left knee cellulitis-started after a possible sharp injury or insect bite. -Significant cellulitis, appreciate orthopedics consult.  No indication for drainage at this time -Continue vancomycin and Rocephin with slow improvement noted. -Ice pack.  Activity as tolerated. -Improving CBC.  Continue steroids-as it could be inflammatory reaction after insect bite. -No improvement noted, consider MRI of the knee tomorrow.  2.  Cough-no history of asthma or wheezing before.  Could be again allergic reaction-insect bite is involved. -Chest x-ray is clear.  Continue steroids. -Tessalon Perles added -No indication for nebs.  3.  DVT prophylaxis-on Lovenox     All the records are reviewed and case discussed with Care Management/Social Workerr. Management plans discussed with the patient, family and they are in agreement.  CODE STATUS: Full Code  TOTAL TIME TAKING CARE OF THIS PATIENT: 36 minutes.   POSSIBLE D/C IN 2 DAYS, DEPENDING ON CLINICAL CONDITION.   Andrell Tallman M.D on 08/05/2018 at 1:31 PM  Between 7am to 6pm - Pager - [phone]  After 6pm go to [email or web address] - password [email or web address] [facility]  Office  [phone]  CC: Primary care physician; Arnetha Courser, MD

## 2018-08-05 NOTE — Consult Note (Signed)
ORTHOPAEDICS PROGRESS NOTE  PATIENT NAME: Jacqueline Rush DOB: July 11, 1961  MRN: [id number]  Subjective: Patient reports some increased discomfort today, but admits to be more active yesterday. She denies any chills. Still with a nonproductive cough.  Objective: Vital signs in last 24 hours: Temp:  [98.1 F (36.7 C)-98.9 F (37.2 C)] 98.3 F (36.8 C) (08/25 1013) Pulse Rate:  [72-85] 72 (08/25 1013) Resp:  [16-20] 16 (08/25 1013) BP: (114-136)/(75-92) 136/92 (08/25 1013) SpO2:  [96 %-100 %] 96 % (08/25 1013)  Intake/Output from previous day: 08/24 0701 - 08/25 0700 In: 94 [IV Piggyback:94] Out: -   Recent Labs    08/02/18 1754 08/03/18 0520 08/04/18 0441 08/05/18 0514  WBC 12.8* 14.4*  --  11.8*  HGB 12.6 11.5*  --  11.9*  HCT 38.1 34.3*  --  35.1  PLT 231 194  --  273  K 4.0 4.1 4.0  --   CL 106 107 110  --   CO2 28 27 26   --   BUN 11 10 10   --   CREATININE 0.96 0.98 0.58  --   GLUCOSE 135* 130* 140*  --   CALCIUM 8.9 8.1* 8.8*  --     EXAM Left lower extremity: Slight improvement in the degree of erythema about the anterior left knee.  No knee effusion.  Good range of motion of the knee.  Soft tissue swelling has also improved.  No gross fluctuance, although the prepatellar bursa is slightly swollen.  Assessment: Cellulitis to the anterior left knee, possible spider bite  Plan: White count continues to improve as does her clinical presentation.  I would agree with continuation of antibiotics today and reassessment tomorrow. May consider MRI imaging of the knee if there is a concern about a significant fluid collection in the prepatellar bursa.  James P. Holley Bouche M.D.

## 2018-08-05 NOTE — Consult Note (Signed)
ORTHOPAEDIC CONSULTATION  PATIENT NAME: Jacqueline Rush DOB: [birth year]  MRN: [id number]  REQUESTING PHYSICIAN: Gladstone Lighter, MD  Chief Complaint: Left knee pain and swelling  HPI: Jacqueline Rush is a 57 y.o. female who complains of the recent onset of left knee swelling, redness, and pain.  Last Saturday while at the gym, she was kneeling down on a mat and had a sudden "sting-like" sensation to the front of the knee.  At the time she thought that it may have been an insect or spider bite and continued with her regular activities.  She noted some progressive soreness, swelling, and redness to the knee and actually presented to her PCP at which time she was prescribed Bactrim for presumed cellulitis.  At the time of presentation to the Emergency Department, she had taken 2 doses of the Bactrim but actually had increasing swelling and pain to the point that she was having difficulty ambulating.  She was noted to have a temperature of above 102 F while in the Emergency Department.  The Emergency Department physician made a limited incision over the anterior aspect of the knee but did not demonstrate any purulent material.  Past Medical History:  Diagnosis Date  . Allergy   . Arthritis   . Family history of adverse reaction to anesthesia    dad had hiccups for 10 days after surgery  . Pre-diabetes   . Varicose veins    Past Surgical History:  Procedure Laterality Date  . BREAST CYST ASPIRATION Left   . CESAREAN SECTION    . EXTERNAL EAR SURGERY     pt states had right ear surgery  . KNEE ARTHROSCOPY WITH MEDIAL MENISECTOMY Right 06/01/2016   Procedure: Arthroscopic partial medial meniscectomy, arthroscopic abrasion chondroplasty of femoral trochlea, and debridement of symptomatic plica, right knee.;  Surgeon: Corky Mull, MD;  Location: [facility];  Service: Orthopedics;  Laterality: Right;  . SHOULDER ARTHROSCOPY WITH DEBRIDEMENT AND BICEP TENDON REPAIR Left 06/30/2016    Procedure: SHOULDER ARTHROSCOPY WITH DEBRIDEMENT AND BICEP TENDON REPAIR;  Surgeon: Corky Mull, MD;  Location: [facility];  Service: Orthopedics;  Laterality: Left;  . SHOULDER ARTHROSCOPY WITH LABRAL REPAIR Left 06/30/2016   Procedure: SHOULDER ARTHROSCOPY WITH LABRAL REPAIR;  Surgeon: Corky Mull, MD;  Location: [facility];  Service: Orthopedics;  Laterality: Left;   Social History   Socioeconomic History  . Marital status: Married    Spouse name: Jenny Reichmann  . Number of children: Not on file  . Years of education: Not on file  . Highest education level: Not on file  Occupational History  . Not on file  Social Needs  . Financial resource strain: Not hard at all  . Food insecurity:    Worry: Patient refused    Inability: Patient refused  . Transportation needs:    Medical: Patient refused    Non-medical: Patient refused  Tobacco Use  . Smoking status: Never Smoker  . Smokeless tobacco: Never Used  Substance and Sexual Activity  . Alcohol use: Yes    Alcohol/week: 0.0 standard drinks    Comment: occasional  . Drug use: No  . Sexual activity: Yes    Partners: Male  Lifestyle  . Physical activity:    Days per week: Patient refused    Minutes per session: Patient refused  . Stress: Not at all  Relationships  . Social connections:    Talks on phone: Patient refused    Gets together: Patient refused    Attends  religious service: Patient refused    Active member of club or organization: Patient refused    Attends meetings of clubs or organizations: Patient refused    Relationship status: Patient refused  Other Topics Concern  . Not on file  Social History Narrative  . Not on file   Family History  Problem Relation Age of Onset  . Cancer Father        colon, prostate, skin  . Diabetes Father   . Colon cancer Father   . Prostate cancer Father   . Ulcerative colitis Daughter   . Hypertension Mother   . Heart disease Mother        heart attack in 2017  . Cancer Maternal  Uncle        prostate and colon  . Colon cancer Maternal Uncle   . Prostate cancer Maternal Uncle   . Stroke Maternal Grandmother   . Heart disease Maternal Grandfather        died from a heart attack  . Cancer Paternal Grandfather        colon  . Colon cancer Paternal Grandfather    Allergies  Allergen Reactions  . Iodinated Diagnostic Agents Swelling    Contrast dye- facial swelling  . Codeine Other (See Comments)   Prior to Admission medications   Medication Sig Start Date End Date Taking? Authorizing Provider  acetaminophen (TYLENOL) 500 MG tablet Take 500 mg by mouth every 6 (six) hours as needed for mild pain or moderate pain.   Yes [provider]  sulfamethoxazole-trimethoprim (BACTRIM DS,SEPTRA DS) 800-160 MG tablet Take 1 tablet by mouth 2 (two) times daily for 7 days. 08/01/18 08/08/18 Yes Hubbard Hartshorn, FNP  metroNIDAZOLE (FLAGYL) 500 MG tablet Take 1 tablet (500 mg total) by mouth 2 (two) times daily. No alcohol or cold medicines that contain alcohol while on this med Patient not taking: Reported on 08/01/2018 02/28/18   Arnetha Courser, MD   Dg Chest 2 View  Result Date: 08/03/2018 CLINICAL DATA:  Wheezing. EXAM: CHEST - 2 VIEW COMPARISON:  Radiograph of September 08, 2006. FINDINGS: The heart size and mediastinal contours are within normal limits. Both lungs are clear. No pneumothorax or pleural effusion is noted. The visualized skeletal structures are unremarkable. IMPRESSION: No active cardiopulmonary disease. Electronically Signed   By: Marijo Conception, M.D.   On: 08/03/2018 15:08   Positive ROS: All other systems have been reviewed and were otherwise negative with the exception of those mentioned in the HPI and as above.  Physical Exam: General: Well developed, well nourished female seen in no apparent discomfort. HEENT: Atraumatic and normocephalic. Sclera are clear. Extraocular motion is intact. Oropharynx is clear with moist mucosa. Neck: Supple,  nontender, good range of motion. Lungs: Clear to auscultation bilaterally. Cardiovascular: Regular rate and rhythm with normal S1 and S2. No murmurs. No gallops or rubs. Pedal pulses are palpable bilaterally. Homans test is negative bilaterally. No significant pretibial or ankle edema. Neurologic: Awake, alert, and oriented. Sensory function is grossly intact. Motor strength is felt to be 5 over 5 bilaterally. No clonus or tremor. Good motor coordination. Lymphatic: No palpable lymphadenopathy.  MUSCULOSKELETAL: Examination of the left knee demonstrates moderate erythema to the anterior aspect of the knee.  Moderate soft tissue swelling is appreciated.  No gross fluctuance is appreciated to palpation.  No knee effusion.  The knee is stable.  No significant fluctuance to the prepatellar bursa.  Assessment: Left knee cellulitis, possible spider bite  Plan:  I agree with the current antibiotic regimen as well as coverage with steroids. Orders were placed for use of a Polar Care to the left knee to decrease swelling. Continue to monitor for clinical improvement.  May need to consider additional imaging if there appears to be some localized fluctuance to the prepatellar bursa.  James P. Holley Bouche M.D.

## 2018-08-05 NOTE — Plan of Care (Signed)
  Problem: Education: Goal: Knowledge of General Education information will improve Description Including pain rating scale, medication(s)/side effects and non-pharmacologic comfort measures Outcome: Progressing   Problem: Health Behavior/Discharge Planning: Goal: Ability to manage health-related needs will improve Outcome: Progressing   Problem: Clinical Measurements: Goal: Ability to avoid or minimize complications of infection will improve Outcome: Progressing   Problem: Skin Integrity: Goal: Skin integrity will improve Outcome: Progressing

## 2018-08-06 LAB — CBC
HEMATOCRIT: 36.1 % (ref 35.0–47.0)
Hemoglobin: 12.2 g/dL (ref 12.0–16.0)
MCH: 30.3 pg (ref 26.0–34.0)
MCHC: 33.8 g/dL (ref 32.0–36.0)
MCV: 89.7 fL (ref 80.0–100.0)
PLATELETS: 330 10*3/uL (ref 150–440)
RBC: 4.03 MIL/uL (ref 3.80–5.20)
RDW: 14 % (ref 11.5–14.5)
WBC: 9.8 10*3/uL (ref 3.6–11.0)

## 2018-08-06 LAB — VANCOMYCIN, TROUGH

## 2018-08-06 MED ORDER — VANCOMYCIN HCL IN DEXTROSE 750-5 MG/150ML-% IV SOLN
750.0000 mg | Freq: Two times a day (BID) | INTRAVENOUS | Status: DC
  Administered 2018-08-06 – 2018-08-07 (×2): 750 mg via INTRAVENOUS
  Filled 2018-08-06 (×3): qty 150

## 2018-08-06 MED ORDER — ENOXAPARIN SODIUM 40 MG/0.4ML ~~LOC~~ SOLN
40.0000 mg | SUBCUTANEOUS | Status: DC
  Administered 2018-08-06: 40 mg via SUBCUTANEOUS
  Filled 2018-08-06: qty 0.4

## 2018-08-06 MED ORDER — RISAQUAD PO CAPS
1.0000 | ORAL_CAPSULE | Freq: Every day | ORAL | Status: DC
  Administered 2018-08-06 – 2018-08-07 (×2): 1 via ORAL
  Filled 2018-08-06 (×2): qty 1

## 2018-08-06 NOTE — Progress Notes (Signed)
Subjective :Patient admitted for cellulitis left knee Patient reports pain as mild.   no nausea and no vomiting Patient states that she is 100% better than what she was at the time of admission.  Swelling and redness has significantly improved with the Polar Care.  Pain is also improved.  States that she is able to get up and move around.  Does have initial discomfort upon getting up.  Objective: Vital signs in last 24 hours: Temp:  [98.2 F (36.8 C)-98.7 F (37.1 C)] 98.3 F (36.8 C) (08/26 1346) Pulse Rate:  [75-84] 79 (08/26 1346) Resp:  [18-20] 20 (08/26 1346) BP: (124-146)/(80-88) 146/88 (08/26 1346) SpO2:  [96 %-99 %] 99 % (08/26 1346) Swelling and redness to left knee has improved.  Did not palpate any fluid within the bursa region. Is tender to palpation over the area. Moving the knee well.  Intake/Output from previous day: 08/25 0701 - 08/26 0700 In: 377.6 [IV Piggyback:377.6] Out: -  Intake/Output this shift: Total I/O In: 240 [P.O.:240] Out: -   Recent Labs    08/05/18 0514 08/06/18 0225  HGB 11.9* 12.2   Recent Labs    08/05/18 0514 08/06/18 0225  WBC 11.8* 9.8  RBC 3.93 4.03  HCT 35.1 36.1  PLT 273 330   Recent Labs    08/04/18 0441  NA 141  K 4.0  CL 110  CO2 26  BUN 10  CREATININE 0.58  GLUCOSE 140*  CALCIUM 8.8*   No results for input(s): LABPT, INR in the last 72 hours.  Neurologically intact Neurovascular intact Sensation intact distally Intact pulses distally Compartment soft  Assessment/Plan: Cellulitis left knee with improvement Case management to assist with discharge planning Physical therapy today Bowel movement today Labs in am Plan to discharge to home once we are able to switch her to oral medication.  Will need to follow-up in [facility] in 7 to 10 days after discharge   Margaruite Top R. 08/06/2018, 1:54 PM

## 2018-08-06 NOTE — Progress Notes (Signed)
[facility] [place] NAME: Jacqueline Rush    MR#:  [id number]  DATE OF BIRTH:  August 09, 1961  SUBJECTIVE:  CHIEF COMPLAINT:   Chief Complaint  Patient presents with  . Knee Pain   -Continues to have improved swelling and redness though it is more concentrated anterior to the patella. -Patient does complain of increased pain in the mornings  REVIEW OF SYSTEMS:  Review of Systems  Constitutional: Negative for chills, fever and malaise/fatigue.  HENT: Negative for congestion, ear discharge, hearing loss and nosebleeds.   Eyes: Negative for blurred vision and double vision.  Respiratory: Negative for cough, shortness of breath and wheezing.   Cardiovascular: Negative for chest pain, palpitations and leg swelling.  Gastrointestinal: Negative for abdominal pain, constipation, diarrhea, nausea and vomiting.  Genitourinary: Negative for dysuria.  Musculoskeletal: Positive for joint pain and myalgias.  Neurological: Negative for dizziness, focal weakness, seizures, weakness and headaches.  Psychiatric/Behavioral: Negative for depression.    DRUG ALLERGIES:   Allergies  Allergen Reactions  . Iodinated Diagnostic Agents Swelling    Contrast dye- facial swelling  . Codeine Other (See Comments)    VITALS:  Blood pressure (!) 146/88, pulse 79, temperature 98.3 F (36.8 C), temperature source Oral, resp. rate 20, height 5' (1.524 m), weight 56.7 kg, SpO2 99 %.  PHYSICAL EXAMINATION:  Physical Exam  GENERAL:  57 y.o.-year-old patient lying in the bed with no acute distress.  EYES: Pupils equal, round, reactive to light and accommodation. No scleral icterus. Extraocular muscles intact.  HEENT: Head atraumatic, normocephalic. Oropharynx and nasopharynx clear.  NECK:  Supple, no jugular venous distention. No thyroid enlargement, no tenderness.  LUNGS: Normal breath sounds bilaterally, no wheezing, rales,rhonchi or crepitation. No use of  accessory muscles of respiration.  CARDIOVASCULAR: S1, S2 normal. No murmurs, rubs, or gallops.  ABDOMEN: Soft, nontender, nondistended. Bowel sounds present. No organomegaly or mass.  EXTREMITIES: Left knee anteriorly significant erythema and swelling - no fluctuant portion.  But more concentrated anteriorly now.  No pedal edema, cyanosis, or clubbing.  NEUROLOGIC: Cranial nerves II through XII are intact. Muscle strength 5/5 in all extremities. Sensation intact. Gait not checked.  PSYCHIATRIC: The patient is alert and oriented x 3.  SKIN: No obvious rash, lesion, or ulcer.    LABORATORY PANEL:   CBC Recent Labs  Lab 08/06/18 0225  WBC 9.8  HGB 12.2  HCT 36.1  PLT 330   ------------------------------------------------------------------------------------------------------------------  Chemistries  Recent Labs  Lab 08/02/18 1754  08/04/18 0441  NA 140   < > 141  K 4.0   < > 4.0  CL 106   < > 110  CO2 28   < > 26  GLUCOSE 135*   < > 140*  BUN 11   < > 10  CREATININE 0.96   < > 0.58  CALCIUM 8.9   < > 8.8*  AST 28  --   --   ALT 19  --   --   ALKPHOS 69  --   --   BILITOT 0.4  --   --    < > = values in this interval not displayed.   ------------------------------------------------------------------------------------------------------------------  Cardiac Enzymes No results for input(s): TROPONINI in the last 168 hours. ------------------------------------------------------------------------------------------------------------------  RADIOLOGY:  No results found.  EKG:   Orders placed or performed in visit on 09/08/06  . EKG 12-Lead    ASSESSMENT AND PLAN:   57 year old female with past medical history significant  for arthritis, varicose veins who is very athletic and works as a Clinical research associate in [employer] presents to hospital secondary to left knee pain and swelling.  1.  Left knee cellulitis-started after a possible sharp injury or insect bite. -Significant  cellulitis and inflammatory swelling of the knee joint. -Appreciate orthopedics consult.  No indication for drainage at this time -on vancomycin and Rocephin with slow improvement noted. -Ice pack.  Activity as tolerated. -Improving CBC.  Continue steroids-as it could be inflammatory reaction after insect bite. -Since there is clinical improvement, hold off on MRI of the knee.  Patient might need superficial lancing to see if purulent discharge will drain out.  Will decide on discharge after Ortho input today -Likely discharge on oral Bactrim  2.  Cough-no history of asthma or wheezing before.  Could be again allergic reaction-insect bite is involved. -Chest x-ray is clear.  Continue steroids. -Tessalon Perles added -No indication for nebs.  3.  DVT prophylaxis-on Lovenox   Continue to encourage ambulation   All the records are reviewed and case discussed with Care Management/Social Workerr. Management plans discussed with the patient, family and they are in agreement.  CODE STATUS: Full Code  TOTAL TIME TAKING CARE OF THIS PATIENT: 36 minutes.   POSSIBLE D/C IN 1-2 DAYS, DEPENDING ON CLINICAL CONDITION.   Abubakr Wieman M.D on 08/06/2018 at 1:55 PM  Between 7am to 6pm - Pager - [phone]  After 6pm go to [email or web address] - password [email or web address] [facility]  Office  [phone]  CC: Primary care physician; Arnetha Courser, MD

## 2018-08-06 NOTE — Consult Note (Signed)
Pharmacy Antibiotic Note  Jacqueline Rush is a 57 y.o. female admitted on 08/02/2018 with cellulitis caused either by sharp object or insect bite. Patient on day 4 of ceftriaxone treatment. Pharmacy has been consulted for Vancomycin dosing.   Goal of Therapy:  Vancomycin trough level 10-15 mcg/ml  Plan:   8/26: Increased frequency of Vancomycin to 750 mg IV q12h since 1st trough on 8/26 @ 0230 was <4. Level before 4th new dose.  Will check trough prior to afternoon dose on 8/27 and continue to monitor.  Height: 5' (152.4 cm) Weight: 125 lb (56.7 kg) IBW/kg (Calculated) : 45.5  Temp (24hrs), Avg:98.5 F (36.9 C), Min:98.2 F (36.8 C), Max:98.7 F (37.1 C)  Recent Labs  Lab 08/02/18 1754 08/02/18 2119 08/03/18 0520 08/04/18 0441 08/05/18 0514 08/06/18 0225  WBC 12.8*  --  14.4*  --  11.8* 9.8  CREATININE 0.96  --  0.98 0.58  --   --   LATICACIDVEN  --  1.3  --   --   --   --   VANCOTROUGH  --   --   --   --   --  <4*    Estimated Creatinine Clearance: 62.7 mL/min (by C-G formula based on SCr of 0.58 mg/dL).    Allergies  Allergen Reactions  . Iodinated Diagnostic Agents Swelling    Contrast dye- facial swelling  . Codeine Other (See Comments)    Antimicrobials this admission: 0822 Ceftriaxone >>  0822 Clindamycin x 1 0822 Vancomycin >>   Microbiology results: 0826 BCx: no growth 4 days   Thank you for allowing pharmacy to be a part of this patient's care.   Janey Greaser Irvin Lizama, PharmD Candidate 08/06/2018 3:15 PM

## 2018-08-06 NOTE — Consult Note (Signed)
Pharmacy Antibiotic Note  Jacqueline Rush is a 57 y.o. female admitted on 08/02/2018 with cellulitis.  Pharmacy has been consulted for Vancomycin dosing.   Assessment: CrCl = 51.2 ml/min Ke = 0.047 hr-1 T1/2  = 14.7 hrs Vd = 39.7 L   Goal of Therapy:  Vancomycin trough level 10-15 mcg/ml  Plan:   Continue Vancomycin 750 mg IV Q24H.  Will draw 1st trough on 8/26 @ 0230  8/26 0230 vanc level <4. Changed to 750 mg q 12 hours. Level before 4th new dose.  Will continue to monitor.  Height: 5' (152.4 cm) Weight: 125 lb (56.7 kg) IBW/kg (Calculated) : 45.5  Temp (24hrs), Avg:98.5 F (36.9 C), Min:98.1 F (36.7 C), Max:98.7 F (37.1 C)  Recent Labs  Lab 08/02/18 1754 08/02/18 2119 08/03/18 0520 08/04/18 0441 08/05/18 0514 08/06/18 0225  WBC 12.8*  --  14.4*  --  11.8* 9.8  CREATININE 0.96  --  0.98 0.58  --   --   LATICACIDVEN  --  1.3  --   --   --   --   VANCOTROUGH  --   --   --   --   --  <4*    Estimated Creatinine Clearance: 62.7 mL/min (by C-G formula based on SCr of 0.58 mg/dL).    Allergies  Allergen Reactions  . Iodinated Diagnostic Agents Swelling    Contrast dye- facial swelling  . Codeine Other (See Comments)    Antimicrobials this admission: 0822 Ceftriaxone >>  0822 Clindamycin x 1 0822 Vancomycin >>   Microbiology results: 0822 BCx: NGTD   Thank you for allowing pharmacy to be a part of this patient's care.   Eloise Harman, PharmD Pharmacy Resident  08/06/2018 3:45 AM

## 2018-08-06 NOTE — Consult Note (Signed)
ORTHOPAEDICS: Marked improvement with erythema and swelling. No fluctuance to prepatellar bursa. Patient remains afebrile. WBC has normalized.  OK to discharge on oral antibiotics (Bactrim). Continue Polar Care and elevation. Recommend follow-up later this week.  Karandeep Resende P. Holley Bouche M.D.

## 2018-08-07 LAB — CBC
HCT: 36.7 % (ref 35.0–47.0)
Hemoglobin: 12.4 g/dL (ref 12.0–16.0)
MCH: 30.4 pg (ref 26.0–34.0)
MCHC: 33.9 g/dL (ref 32.0–36.0)
MCV: 89.5 fL (ref 80.0–100.0)
PLATELETS: 354 10*3/uL (ref 150–440)
RBC: 4.1 MIL/uL (ref 3.80–5.20)
RDW: 13.7 % (ref 11.5–14.5)
WBC: 10.3 10*3/uL (ref 3.6–11.0)

## 2018-08-07 LAB — BASIC METABOLIC PANEL
Anion gap: 6 (ref 5–15)
BUN: 13 mg/dL (ref 6–20)
CALCIUM: 8.5 mg/dL — AB (ref 8.9–10.3)
CHLORIDE: 109 mmol/L (ref 98–111)
CO2: 29 mmol/L (ref 22–32)
CREATININE: 0.67 mg/dL (ref 0.44–1.00)
GFR calc Af Amer: >60 mL/min (ref >60)
GFR calc non Af Amer: >60 mL/min (ref >60)
GLUCOSE: 96 mg/dL (ref 70–99)
Potassium: 3.8 mmol/L (ref 3.5–5.1)
Sodium: 144 mmol/L (ref 135–145)

## 2018-08-07 LAB — CULTURE, BLOOD (ROUTINE X 2)
CULTURE: NO GROWTH
Culture: NO GROWTH

## 2018-08-07 MED ORDER — RISAQUAD PO CAPS: 1.0000 | ORAL_CAPSULE | Freq: Every day | ORAL | 0 refills | Status: DC

## 2018-08-07 MED ORDER — SULFAMETHOXAZOLE-TRIMETHOPRIM 800-160 MG PO TABS: 1.0000 | ORAL_TABLET | Freq: Two times a day (BID) | ORAL | 0 refills | Status: AC

## 2018-08-07 MED ORDER — SULFAMETHOXAZOLE-TRIMETHOPRIM 800-160 MG PO TABS: 1.0000 | ORAL_TABLET | Freq: Two times a day (BID) | ORAL | 0 refills | Status: DC

## 2018-08-07 MED ORDER — PREDNISONE 20 MG PO TABS: 40.0000 mg | ORAL_TABLET | Freq: Every day | ORAL | 0 refills | Status: DC

## 2018-08-07 MED ORDER — PREDNISONE 20 MG PO TABS: 40.0000 mg | ORAL_TABLET | Freq: Every day | ORAL | 0 refills | Status: AC

## 2018-08-07 MED ORDER — OXYCODONE HCL 5 MG PO TABS: 5.0000 mg | ORAL_TABLET | Freq: Four times a day (QID) | ORAL | 0 refills | Status: DC | PRN

## 2018-08-07 NOTE — Care Management Note (Signed)
Case Management Note  Patient Details  Name: LUCINDIA LEMLEY MRN: [id number] Date of Birth: 1961/08/07  Subjective/Objective:  Met with patient at bedside to discuss discharge planning. Patient uninsured. She states she is just in between plans right now. Will have a new plan between Oct. And Nov. Patient has a PCP, Dr. Sanda Klein and would like to keep her. She needs medication assistance. Application given for medication management clinic. No further concerns noted. Case closed.                    Action/Plan:   Expected Discharge Date:  08/07/18               Expected Discharge Plan:  Home/Self Care  In-House Referral:     Discharge planning Services  CM Consult, Medication Assistance  Post Acute Care Choice:    Choice offered to:  Patient  DME Arranged:    DME Agency:     HH Arranged:    [place] Agency:     Status of Service:  Completed, signed off  If discussed at [employer] of Stay Meetings, dates discussed:    Additional Comments:  Jolly Mango, RN 08/07/2018, 10:29 AM

## 2018-08-07 NOTE — Progress Notes (Signed)
Pt d/ced home.  L knee is improved but still swollen and has bullae at center of knee.  She is d/ced home on prednisone, abx and probiotic.  Reviewed d/c instructions, f/u appts and medications.  Got scripts printed so pt can take them over to Medication Mgmt.  Pt is using polar care for swelling.  Instructed her to call dr asap if she starts to run a fever.  Mother picked her up to transport her home.  IV removed.

## 2018-08-07 NOTE — Plan of Care (Signed)
  Problem: Education: Goal: Knowledge of General Education information will improve Description Including pain rating scale, medication(s)/side effects and non-pharmacologic comfort measures Outcome: Progressing   Problem: Health Behavior/Discharge Planning: Goal: Ability to manage health-related needs will improve Outcome: Progressing   Problem: Clinical Measurements: Goal: Ability to avoid or minimize complications of infection will improve Outcome: Progressing   Problem: Skin Integrity: Goal: Skin integrity will improve Outcome: Progressing

## 2018-08-07 NOTE — Discharge Summary (Signed)
[facility] NAME: Jacqueline Rush    MR#:  [id number]  DATE OF BIRTH:  [birth year]  DATE OF ADMISSION:  08/02/2018   ADMITTING PHYSICIAN: Sela Hua, MD  DATE OF DISCHARGE:  08/07/18  PRIMARY CARE PHYSICIAN: Arnetha Courser, MD   ADMISSION DIAGNOSIS:   Cellulitis of left knee [V78.469]  DISCHARGE DIAGNOSIS:   Active Problems:   Sepsis due to cellulitis [facility])   SECONDARY DIAGNOSIS:   Past Medical History:  Diagnosis Date  . Allergy   . Arthritis   . Family history of adverse reaction to anesthesia    dad had hiccups for 10 days after surgery  . Pre-diabetes   . Varicose veins     HOSPITAL COURSE:   57 year old female with past medical history significant for arthritis, varicose veins who is very athletic and works as a Clinical research associate in [employer] presents to hospital secondary to left knee pain and swelling.  1.  Left knee cellulitis-started after a possible sharp injury or insect bite. -Significant cellulitis and inflammatory swelling of the knee joint. -Appreciate orthopedics consult.  No indication for drainage at this time -on vancomycin and Rocephin with improvement noted. -Ice pack.  Activity as tolerated. -Improved CBC.    Received steroids-as it could be inflammatory reaction after insect bite.  Will be discharged on oral prednisone -Since there is clinical improvement, hold off on MRI of the knee.   -discharge on oral Bactrim, follow-up with orthopedics in less than a week to see if any superficial abscess develops that can be drained  2.  Cough-no history of asthma or wheezing before.  Could be again allergic reaction- especially if insect bite is involved. -Chest x-ray is clear.  on steroids. -Tessalon Perles with improvement here -No indication for nebs.  Received inhalers here with improvement.  No inhalers at discharge  DISCHARGE CONDITIONS:   Guarded  CONSULTS OBTAINED:   Treatment Team:    Dereck Leep, MD  DRUG ALLERGIES:   Allergies  Allergen Reactions  . Iodinated Diagnostic Agents Swelling    Contrast dye- facial swelling  . Codeine Other (See Comments)   DISCHARGE MEDICATIONS:   Allergies as of 08/07/2018      Reactions   Iodinated Diagnostic Agents Swelling   Contrast dye- facial swelling   Codeine Other (See Comments)      Medication List    STOP taking these medications   metroNIDAZOLE 500 MG tablet Commonly known as:  FLAGYL     TAKE these medications   acetaminophen 500 MG tablet Commonly known as:  TYLENOL Take 500 mg by mouth every 6 (six) hours as needed for mild pain or moderate pain.   acidophilus Caps capsule Take 1 capsule by mouth daily.   oxyCODONE 5 MG immediate release tablet Commonly known as:  Oxy IR/ROXICODONE Take 1 tablet (5 mg total) by mouth every 6 (six) hours as needed for moderate pain or severe pain.   predniSONE 20 MG tablet Commonly known as:  DELTASONE Take 2 tablets (40 mg total) by mouth daily for 6 days.   sulfamethoxazole-trimethoprim 800-160 MG tablet Commonly known as:  BACTRIM DS,SEPTRA DS Take 1 tablet by mouth 2 (two) times daily for 10 days.        DISCHARGE INSTRUCTIONS:   1. PCP f/u in 1-2 weeks 2. Orthopedics f/u in 1 week  DIET:   Regular diet  ACTIVITY:   Activity as tolerated  OXYGEN:   Home  Oxygen: No.  Oxygen Delivery: room air  DISCHARGE LOCATION:   home   If you experience worsening of your admission symptoms, develop shortness of breath, life threatening emergency, suicidal or homicidal thoughts you must seek medical attention immediately by calling 911 or calling your MD immediately  if symptoms less severe.  You Must read complete instructions/literature along with all the possible adverse reactions/side effects for all the Medicines you take and that have been prescribed to you. Take any new Medicines after you have completely understood and accpet all the possible  adverse reactions/side effects.   Please note  You were cared for by a hospitalist during your hospital stay. If you have any questions about your discharge medications or the care you received while you were in the hospital after you are discharged, you can call the unit and asked to speak with the hospitalist on call if the hospitalist that took care of you is not available. Once you are discharged, your primary care physician will handle any further medical issues. Please note that NO REFILLS for any discharge medications will be authorized once you are discharged, as it is imperative that you return to your primary care physician (or establish a relationship with a primary care physician if you do not have one) for your aftercare needs so that they can reassess your need for medications and monitor your lab values.    On the day of Discharge:  VITAL SIGNS:   Blood pressure 125/80, pulse 64, temperature 98.2 F (36.8 C), temperature source Oral, resp. rate 18, height 5' (1.524 m), weight 56.7 kg, SpO2 96 %.  PHYSICAL EXAMINATION:   GENERAL:  57 y.o.-year-old patient lying in the bed with no acute distress.  EYES: Pupils equal, round, reactive to light and accommodation. No scleral icterus. Extraocular muscles intact.  HEENT: Head atraumatic, normocephalic. Oropharynx and nasopharynx clear.  NECK:  Supple, no jugular venous distention. No thyroid enlargement, no tenderness.  LUNGS: Normal breath sounds bilaterally, no wheezing, rales,rhonchi or crepitation. No use of accessory muscles of respiration.  CARDIOVASCULAR: S1, S2 normal. No murmurs, rubs, or gallops.  ABDOMEN: Soft, nontender, nondistended. Bowel sounds present. No organomegaly or mass.  EXTREMITIES: Left knee anteriorly significant erythema and swelling - no fluctuant portion.  But more concentrated anteriorly now.  No pedal edema, cyanosis, or clubbing.  NEUROLOGIC: Cranial nerves II through XII are intact. Muscle strength 5/5  in all extremities. Sensation intact. Gait not checked.  PSYCHIATRIC: The patient is alert and oriented x 3.  SKIN: No obvious rash, lesion, or ulcer.   DATA REVIEW:   CBC Recent Labs  Lab 08/07/18 0328  WBC 10.3  HGB 12.4  HCT 36.7  PLT 354    Chemistries  Recent Labs  Lab 08/02/18 1754  08/07/18 0328  NA 140   < > 144  K 4.0   < > 3.8  CL 106   < > 109  CO2 28   < > 29  GLUCOSE 135*   < > 96  BUN 11   < > 13  CREATININE 0.96   < > 0.67  CALCIUM 8.9   < > 8.5*  AST 28  --   --   ALT 19  --   --   ALKPHOS 69  --   --   BILITOT 0.4  --   --    < > = values in this interval not displayed.     Microbiology Results  Results for orders placed or  performed during the hospital encounter of 08/02/18  CULTURE, BLOOD (ROUTINE X 2) w Reflex to ID Panel     Status: None   Collection Time: 08/02/18  9:07 PM  Result Value Ref Range Status   Specimen Description BLOOD BLOOD RIGHT HAND  Final   Special Requests   Final    BOTTLES DRAWN AEROBIC AND ANAEROBIC Blood Culture results may not be optimal due to an inadequate volume of blood received in culture bottles   Culture   Final    NO GROWTH 5 DAYS Performed at [facility], [address] [place], [place] [address]    Report Status 08/07/2018 FINAL  Final  CULTURE, BLOOD (ROUTINE X 2) w Reflex to ID Panel     Status: None   Collection Time: 08/02/18  9:19 PM  Result Value Ref Range Status   Specimen Description BLOOD RIGHT ANTECUBITAL  Final   Special Requests   Final    BOTTLES DRAWN AEROBIC AND ANAEROBIC Blood Culture results may not be optimal due to an inadequate volume of blood received in culture bottles   Culture   Final    NO GROWTH 5 DAYS Performed at [facility], [address], [place],  [address]    Report Status 08/07/2018 FINAL  Final    RADIOLOGY:  No results found.   Management plans discussed with the patient, family and they are in agreement.  CODE STATUS:       Code Status Orders  (From admission, onward)         Start     Ordered   08/02/18 2217  Full code  Continuous     08/02/18 2216        Code Status History    Date Active Date Inactive Code Status Order ID Comments User Context   06/01/2016 1449 06/01/2016 1751 Full Code [id number]  Poggi, Marshall Cork, MD Inpatient      TOTAL TIME TAKING CARE OF THIS PATIENT: 38  minutes.    Gladstone Lighter M.D on 08/07/2018 at 8:41 AM  Between 7am to 6pm - Pager - [phone]  After 6pm go to [email or web address] - Proofreader  [facility]  Office  [phone]  CC: Primary care physician; Arnetha Courser, MD   Note: This dictation was prepared with Dragon dictation along with smaller phrase technology. Any transcriptional errors that result from this process are unintentional.

## 2018-08-09 ENCOUNTER — Encounter: Payer: Self-pay | Admitting: *Deleted

## 2018-08-09 ENCOUNTER — Ambulatory Visit: Payer: Self-pay | Admitting: Anesthesiology

## 2018-08-09 ENCOUNTER — Other Ambulatory Visit: Payer: Self-pay

## 2018-08-09 ENCOUNTER — Observation Stay
Admission: RE | Admit: 2018-08-09 | Discharge: 2018-08-10 | Disposition: A | Discharge status: Home / Self Care | Payer: Self-pay | Source: Ambulatory Visit | Attending: Surgery | Admitting: Surgery

## 2018-08-09 ENCOUNTER — Encounter
Admission: RE | Disposition: A | Discharge status: Home / Self Care | Payer: Self-pay | Source: Ambulatory Visit | Attending: Surgery

## 2018-08-09 DIAGNOSIS — Z885 Allergy status to narcotic agent status: Secondary | ICD-10-CM | POA: Insufficient documentation

## 2018-08-09 DIAGNOSIS — B9562 Methicillin resistant Staphylococcus aureus infection as the cause of diseases classified elsewhere: Secondary | ICD-10-CM | POA: Insufficient documentation

## 2018-08-09 DIAGNOSIS — M1711 Unilateral primary osteoarthritis, right knee: Secondary | ICD-10-CM | POA: Insufficient documentation

## 2018-08-09 DIAGNOSIS — R262 Difficulty in walking, not elsewhere classified: Secondary | ICD-10-CM | POA: Insufficient documentation

## 2018-08-09 DIAGNOSIS — M71162 Other infective bursitis, left knee: Principal | ICD-10-CM | POA: Diagnosis present

## 2018-08-09 DIAGNOSIS — R7303 Prediabetes: Secondary | ICD-10-CM | POA: Insufficient documentation

## 2018-08-09 HISTORY — PX: IRRIGATION AND DEBRIDEMENT KNEE: SHX5185

## 2018-08-09 LAB — GLUCOSE, CAPILLARY
GLUCOSE-CAPILLARY: 103 mg/dL — AB (ref 70–99)
Glucose-Capillary: 102 mg/dL — ABNORMAL HIGH (ref 70–99)

## 2018-08-09 SURGERY — IRRIGATION AND DEBRIDEMENT KNEE
Anesthesia: Choice | Site: Knee | Laterality: Left | Wound class: Dirty or Infected

## 2018-08-09 MED ORDER — MAGNESIUM HYDROXIDE 400 MG/5ML PO SUSP: 30.0000 mL | Freq: Every day | ORAL | Status: DC | PRN

## 2018-08-09 MED ORDER — LIDOCAINE HCL (PF) 2 % IJ SOLN
INTRAMUSCULAR | Status: AC
  Filled 2018-08-09: qty 10

## 2018-08-09 MED ORDER — FLEET ENEMA 7-19 GM/118ML RE ENEM: 1.0000 | ENEMA | Freq: Once | RECTAL | Status: DC | PRN

## 2018-08-09 MED ORDER — ENOXAPARIN SODIUM 40 MG/0.4ML ~~LOC~~ SOLN
40.0000 mg | SUBCUTANEOUS | Status: DC
  Administered 2018-08-10: 40 mg via SUBCUTANEOUS
  Filled 2018-08-09: qty 0.4

## 2018-08-09 MED ORDER — BISACODYL 10 MG RE SUPP: 10.0000 mg | Freq: Every day | RECTAL | Status: DC | PRN

## 2018-08-09 MED ORDER — HYDROMORPHONE HCL 1 MG/ML IJ SOLN: 0.5000 mg | INTRAMUSCULAR | Status: DC | PRN

## 2018-08-09 MED ORDER — FENTANYL CITRATE (PF) 100 MCG/2ML IJ SOLN
INTRAMUSCULAR | Status: AC
  Administered 2018-08-09: 25 ug via INTRAVENOUS
  Filled 2018-08-09: qty 2

## 2018-08-09 MED ORDER — PROPOFOL 10 MG/ML IV BOLUS
INTRAVENOUS | Status: DC | PRN
  Administered 2018-08-09: 150 mg via INTRAVENOUS
  Administered 2018-08-09: 30 mg via INTRAVENOUS

## 2018-08-09 MED ORDER — MIDAZOLAM HCL 2 MG/2ML IJ SOLN
INTRAMUSCULAR | Status: AC
  Filled 2018-08-09: qty 2

## 2018-08-09 MED ORDER — LACTATED RINGERS IV SOLN
INTRAVENOUS | Status: DC | PRN
  Administered 2018-08-09: 16:00:00 via INTRAVENOUS

## 2018-08-09 MED ORDER — DOCUSATE SODIUM 100 MG PO CAPS
100.0000 mg | ORAL_CAPSULE | Freq: Two times a day (BID) | ORAL | Status: DC
  Administered 2018-08-09 – 2018-08-10 (×2): 100 mg via ORAL
  Filled 2018-08-09 (×2): qty 1

## 2018-08-09 MED ORDER — ONDANSETRON HCL 4 MG/2ML IJ SOLN: 4.0000 mg | Freq: Four times a day (QID) | INTRAMUSCULAR | Status: DC | PRN

## 2018-08-09 MED ORDER — ONDANSETRON HCL 4 MG/2ML IJ SOLN
INTRAMUSCULAR | Status: DC | PRN
  Administered 2018-08-09: 4 mg via INTRAVENOUS

## 2018-08-09 MED ORDER — DEXAMETHASONE SODIUM PHOSPHATE 10 MG/ML IJ SOLN
INTRAMUSCULAR | Status: AC
  Filled 2018-08-09: qty 1

## 2018-08-09 MED ORDER — ONDANSETRON HCL 4 MG/2ML IJ SOLN: 4.0000 mg | Freq: Once | INTRAMUSCULAR | Status: DC | PRN

## 2018-08-09 MED ORDER — METOCLOPRAMIDE HCL 5 MG/ML IJ SOLN: 5.0000 mg | Freq: Three times a day (TID) | INTRAMUSCULAR | Status: DC | PRN

## 2018-08-09 MED ORDER — FENTANYL CITRATE (PF) 100 MCG/2ML IJ SOLN
25.0000 ug | INTRAMUSCULAR | Status: AC | PRN
  Administered 2018-08-09 (×6): 25 ug via INTRAVENOUS

## 2018-08-09 MED ORDER — ACETAMINOPHEN 500 MG PO TABS
1000.0000 mg | ORAL_TABLET | Freq: Four times a day (QID) | ORAL | Status: AC
  Administered 2018-08-09 – 2018-08-10 (×3): 1000 mg via ORAL
  Filled 2018-08-09 (×3): qty 2

## 2018-08-09 MED ORDER — CEFAZOLIN SODIUM-DEXTROSE 2-4 GM/100ML-% IV SOLN
INTRAVENOUS | Status: AC
  Filled 2018-08-09: qty 100

## 2018-08-09 MED ORDER — NEOMYCIN-POLYMYXIN B GU 40-200000 IR SOLN
Status: DC | PRN
  Administered 2018-08-09: 4 mL

## 2018-08-09 MED ORDER — VANCOMYCIN HCL IN DEXTROSE 1-5 GM/200ML-% IV SOLN
1000.0000 mg | Freq: Two times a day (BID) | INTRAVENOUS | Status: AC
  Administered 2018-08-10 (×2): 1000 mg via INTRAVENOUS
  Filled 2018-08-09 (×2): qty 200

## 2018-08-09 MED ORDER — ONDANSETRON HCL 4 MG PO TABS: 4.0000 mg | ORAL_TABLET | Freq: Four times a day (QID) | ORAL | Status: DC | PRN

## 2018-08-09 MED ORDER — FENTANYL CITRATE (PF) 100 MCG/2ML IJ SOLN
INTRAMUSCULAR | Status: DC | PRN
  Administered 2018-08-09 (×2): 50 ug via INTRAVENOUS

## 2018-08-09 MED ORDER — OXYCODONE HCL 5 MG PO TABS
5.0000 mg | ORAL_TABLET | ORAL | Status: DC | PRN
  Administered 2018-08-09 – 2018-08-10 (×3): 10 mg via ORAL
  Filled 2018-08-09 (×3): qty 2

## 2018-08-09 MED ORDER — ONDANSETRON HCL 4 MG/2ML IJ SOLN
INTRAMUSCULAR | Status: AC
  Filled 2018-08-09: qty 2

## 2018-08-09 MED ORDER — SODIUM CHLORIDE 0.9 % IV SOLN
INTRAVENOUS | Status: DC
  Administered 2018-08-09: 19:00:00 via INTRAVENOUS

## 2018-08-09 MED ORDER — RISAQUAD PO CAPS
1.0000 | ORAL_CAPSULE | Freq: Every day | ORAL | Status: DC
  Administered 2018-08-10: 1 via ORAL
  Filled 2018-08-09 (×2): qty 1

## 2018-08-09 MED ORDER — SODIUM CHLORIDE 0.9 % IV SOLN
Freq: Once | INTRAVENOUS | Status: AC
  Administered 2018-08-09: 13:00:00 via INTRAVENOUS

## 2018-08-09 MED ORDER — ACETAMINOPHEN 325 MG PO TABS: 325.0000 mg | ORAL_TABLET | Freq: Four times a day (QID) | ORAL | Status: DC | PRN

## 2018-08-09 MED ORDER — FENTANYL CITRATE (PF) 100 MCG/2ML IJ SOLN
INTRAMUSCULAR | Status: AC
  Filled 2018-08-09: qty 2

## 2018-08-09 MED ORDER — GLYCOPYRROLATE 0.2 MG/ML IJ SOLN
INTRAMUSCULAR | Status: AC
  Filled 2018-08-09: qty 1

## 2018-08-09 MED ORDER — KETOROLAC TROMETHAMINE 15 MG/ML IJ SOLN
15.0000 mg | Freq: Four times a day (QID) | INTRAMUSCULAR | Status: AC
  Administered 2018-08-09 – 2018-08-10 (×4): 15 mg via INTRAVENOUS
  Filled 2018-08-09 (×4): qty 1

## 2018-08-09 MED ORDER — GLYCOPYRROLATE 0.2 MG/ML IJ SOLN
INTRAMUSCULAR | Status: DC | PRN
  Administered 2018-08-09: 0.2 mg via INTRAVENOUS

## 2018-08-09 MED ORDER — NEOMYCIN-POLYMYXIN B GU 40-200000 IR SOLN
Status: AC
  Filled 2018-08-09: qty 4

## 2018-08-09 MED ORDER — LIDOCAINE HCL (CARDIAC) PF 100 MG/5ML IV SOSY
PREFILLED_SYRINGE | INTRAVENOUS | Status: DC | PRN
  Administered 2018-08-09: 60 mg via INTRAVENOUS

## 2018-08-09 MED ORDER — DIPHENHYDRAMINE HCL 12.5 MG/5ML PO ELIX: 12.5000 mg | ORAL_SOLUTION | ORAL | Status: DC | PRN

## 2018-08-09 MED ORDER — MIDAZOLAM HCL 2 MG/2ML IJ SOLN
INTRAMUSCULAR | Status: DC | PRN
  Administered 2018-08-09: 2 mg via INTRAVENOUS

## 2018-08-09 MED ORDER — METOCLOPRAMIDE HCL 10 MG PO TABS: 5.0000 mg | ORAL_TABLET | Freq: Three times a day (TID) | ORAL | Status: DC | PRN

## 2018-08-09 MED ORDER — CEFAZOLIN SODIUM-DEXTROSE 2-4 GM/100ML-% IV SOLN
2.0000 g | Freq: Once | INTRAVENOUS | Status: AC
  Administered 2018-08-09: 2 g via INTRAVENOUS

## 2018-08-09 SURGICAL SUPPLY — 42 items
BANDAGE ACE 6X5 VEL STRL LF (GAUZE/BANDAGES/DRESSINGS) ×2 IMPLANT
BLADE SURG SZ10 CARB STEEL (BLADE) ×2 IMPLANT
BNDG COHESIVE 4X5 TAN STRL (GAUZE/BANDAGES/DRESSINGS) ×2 IMPLANT
BNDG ESMARK 4X12 TAN STRL LF (GAUZE/BANDAGES/DRESSINGS) ×2 IMPLANT
CANISTER SUCT 1200ML W/VALVE (MISCELLANEOUS) ×2 IMPLANT
CHLORAPREP W/TINT 26ML (MISCELLANEOUS) ×2 IMPLANT
CUFF TOURN 18 STER (MISCELLANEOUS) IMPLANT
CUFF TOURN 24 STER (MISCELLANEOUS) IMPLANT
CUFF TOURN 30 STER DUAL PORT (MISCELLANEOUS) ×2 IMPLANT
ELECT REM PT RETURN 9FT ADLT (ELECTROSURGICAL) ×2
ELECTRODE REM PT RTRN 9FT ADLT (ELECTROSURGICAL) ×1 IMPLANT
GAUZE PETRO XEROFOAM 1X8 (MISCELLANEOUS) ×2 IMPLANT
GAUZE SPONGE 4X4 12PLY STRL (GAUZE/BANDAGES/DRESSINGS) ×2 IMPLANT
GLOVE BIO SURGEON STRL SZ8 (GLOVE) ×2 IMPLANT
GLOVE INDICATOR 8.0 STRL GRN (GLOVE) ×2 IMPLANT
GLOVE SURG ORTHO 8.5 STRL (GLOVE) ×2 IMPLANT
GOWN STRL REUS W/ TWL LRG LVL3 (GOWN DISPOSABLE) ×1 IMPLANT
GOWN STRL REUS W/ TWL XL LVL3 (GOWN DISPOSABLE) ×1 IMPLANT
GOWN STRL REUS W/TWL LRG LVL3 (GOWN DISPOSABLE) ×1
GOWN STRL REUS W/TWL XL LVL3 (GOWN DISPOSABLE) ×1
HEMOVAC 400CC 10FR (MISCELLANEOUS) ×2 IMPLANT
KIT TURNOVER KIT A (KITS) ×2 IMPLANT
LABEL OR SOLS (LABEL) IMPLANT
NDL SAFETY ECLIPSE 18X1.5 (NEEDLE) ×1 IMPLANT
NEEDLE HYPO 18GX1.5 SHARP (NEEDLE) ×1
NS IRRIG 1000ML POUR BTL (IV SOLUTION) ×2 IMPLANT
PACK EXTREMITY ARMC (MISCELLANEOUS) ×2 IMPLANT
PAD CAST CTTN 4X4 STRL (SOFTGOODS) IMPLANT
PADDING CAST COTTON 4X4 STRL (SOFTGOODS)
SPLINT CAST 1 STEP 3X12 (MISCELLANEOUS) IMPLANT
SPONGE LAP 18X18 RF (DISPOSABLE) ×2 IMPLANT
STAPLER SKIN PROX 35W (STAPLE) ×2 IMPLANT
STOCKINETTE BIAS CUT 4 980044 (GAUZE/BANDAGES/DRESSINGS) IMPLANT
STOCKINETTE IMPERVIOUS 9X36 MD (GAUZE/BANDAGES/DRESSINGS) ×2 IMPLANT
SUT ETHILON 4-0 (SUTURE)
SUT ETHILON 4-0 FS2 18XMFL BLK (SUTURE)
SUT VIC AB 2-0 CT1 36 (SUTURE) ×2 IMPLANT
SUT VIC AB 4-0 SH 27 (SUTURE)
SUT VIC AB 4-0 SH 27XANBCTRL (SUTURE) IMPLANT
SUT VICRYL+ 3-0 36IN CT-1 (SUTURE) IMPLANT
SUTURE ETHLN 4-0 FS2 18XMF BLK (SUTURE) IMPLANT
SYR 10ML LL (SYRINGE) ×2 IMPLANT

## 2018-08-09 NOTE — H&P (Signed)
Paper H&P to be scanned into permanent record. H&P reviewed and patient re-examined. No changes. 

## 2018-08-09 NOTE — Plan of Care (Signed)

## 2018-08-09 NOTE — Op Note (Signed)
08/09/2018  5:10 PM  Patient:   Jacqueline Rush  Pre-Op Diagnosis:   Septic prepatellar bursa, left knee.  Post-Op Diagnosis:   Same  Procedure:   Irrigation and debridement with excision of septic prepatellar bursa, left knee.  Surgeon:   Pascal Lux, MD  Assistant:   None  Anesthesia:   General LMA  Findings:   As above.  Complications:   None  Fluids:   600 cc crystalloid  EBL:   5 cc  UOP:   None  TT:   27 minutes at 300 mmHg  Drains:   Hemovac x1  Closure:   Staples  Brief Clinical Note:   The patient is a 57 year old female with a one-week history of pain, swelling, erythema involving the anterior aspect of her left knee.  She was admitted for several days for IV antibiotics and appeared to be improving, so she was sent home on p.o. antibiotics.  However, her symptoms began to worsen, prompting her to return to the office today.  She presents at this time for formal irrigation and debridement with excision of the septic left patellar bursa.  Procedure:   The patient was brought into the operating room and lain in the supine position.  After adequate general laryngeal mask anesthesia was obtained, the patient's left lower extremity was prepped with ChloraPrep solution before being draped sterilely.  Preoperative antibiotics were administered.  A timeout was performed to verify the appropriate surgical site.  The left lower extremity was elevated for approximately 1 minute before the thigh tourniquet was inflated to 300 mmHg.  An approximately 6-7 cm incision was made over the anterior aspect of the knee longitudinally, incorporating the small scabbed area where drainage had been noted previously.  This area of drainage was ellipsed.  The incision was carried down through the subcutaneous tissues to expose the bursa.  A fluid sample was sent for culture and sensitivity before the bursa was excised in its entirety.  The wound was copiously irrigated with 1 L of  bacitracin saline solution utilizing bulb irrigation.  A single limb to a Hemovac drain was placed before the subcutaneous tissues were closed using 2-0 Vicryl interrupted sutures.  The skin was closed using staples.  A sterile bulky dressing was applied to the knee before the patient was awakened, extubated, and returned to the recovery room in satisfactory condition after tolerating the procedure well.

## 2018-08-09 NOTE — Anesthesia Procedure Notes (Signed)
Procedure Name: LMA Insertion Date/Time: 08/09/2018 4:17 PM Performed by: Doreen Salvage, CRNA Pre-anesthesia Checklist: Patient identified, Patient being monitored, Timeout performed, Emergency Drugs available and Suction available Patient Re-evaluated:Patient Re-evaluated prior to induction Oxygen Delivery Method: Circle system utilized Preoxygenation: Pre-oxygenation with 100% oxygen Induction Type: IV induction Ventilation: Mask ventilation without difficulty LMA: LMA inserted LMA Size: 3.5 Tube type: Oral Number of attempts: 1 Placement Confirmation: positive ETCO2 and breath sounds checked- equal and bilateral Tube secured with: Tape Dental Injury: Teeth and Oropharynx as per pre-operative assessment

## 2018-08-09 NOTE — Anesthesia Preprocedure Evaluation (Signed)
Anesthesia Evaluation  Patient identified by MRN, date of birth, ID band Patient awake    Reviewed: Allergy & Precautions, H&P , NPO status , Patient's Chart, lab work & pertinent test results, reviewed documented beta blocker date and time   Airway Mallampati: II   Neck ROM: full    Dental  (+) Poor Dentition   Pulmonary neg pulmonary ROS,    Pulmonary exam normal        Cardiovascular negative cardio ROS Normal cardiovascular exam Rhythm:regular Rate:Normal     Neuro/Psych negative neurological ROS  negative psych ROS   GI/Hepatic negative GI ROS, Neg liver ROS,   Endo/Other  negative endocrine ROS  Renal/GU negative Renal ROS  negative genitourinary   Musculoskeletal   Abdominal   Peds  Hematology negative hematology ROS (+)   Anesthesia Other Findings Past Medical History: No date: Allergy No date: Arthritis No date: Family history of adverse reaction to anesthesia     Comment:  dad had hiccups for 10 days after surgery No date: Pre-diabetes No date: Varicose veins Past Surgical History: No date: BREAST CYST ASPIRATION; Left No date: CESAREAN SECTION No date: EXTERNAL EAR SURGERY     Comment:  pt states had right ear surgery 06/01/2016: KNEE ARTHROSCOPY WITH MEDIAL MENISECTOMY; Right     Comment:  Procedure: Arthroscopic partial medial meniscectomy,               arthroscopic abrasion chondroplasty of femoral trochlea,               and debridement of symptomatic plica, right knee.;                Surgeon: Corky Mull, MD;  Location: [facility];  Service: Orthopedics;  Laterality: Right; 06/30/2016: SHOULDER ARTHROSCOPY WITH DEBRIDEMENT AND BICEP TENDON  REPAIR; Left     Comment:  Procedure: SHOULDER ARTHROSCOPY WITH DEBRIDEMENT AND               BICEP TENDON REPAIR;  Surgeon: Corky Mull, MD;                Location: [facility];  Service: Orthopedics;  Laterality:   Left; 06/30/2016: SHOULDER ARTHROSCOPY WITH LABRAL REPAIR; Left     Comment:  Procedure: SHOULDER ARTHROSCOPY WITH LABRAL REPAIR;                Surgeon: Corky Mull, MD;  Location: [facility];  Service:              Orthopedics;  Laterality: Left; BMI    Body Mass Index:  24.41 kg/m     Reproductive/Obstetrics negative OB ROS                             Anesthesia Physical Anesthesia Plan  ASA: II  Anesthesia Plan: General   Post-op Pain Management:    Induction:   PONV Risk Score and Plan:   Airway Management Planned:   Additional Equipment:   Intra-op Plan:   Post-operative Plan:   Informed Consent: I have reviewed the patients History and Physical, chart, labs and discussed the procedure including the risks, benefits and alternatives for the proposed anesthesia with the patient or authorized representative who has indicated his/her understanding and acceptance.   Dental Advisory Given  Plan Discussed with: CRNA  Anesthesia Plan Comments:  Anesthesia Quick Evaluation  

## 2018-08-09 NOTE — Transfer of Care (Signed)
Immediate Anesthesia Transfer of Care Note  Patient: Jacqueline Rush  Procedure(s) Performed: IRRIGATION AND DEBRIDEMENT KNEE WITH BURSA EXCISION (Left Knee)  Patient Location: PACU  Anesthesia Type:General  Level of Consciousness: awake  Airway & Oxygen Therapy: Patient connected to face mask oxygen  Post-op Assessment: Post -op Vital signs reviewed and stable  Post vital signs: stable  Last Vitals:  Vitals Value Taken Time  BP 151/99 08/09/2018  5:14 PM  Temp    Pulse 96 08/09/2018  5:14 PM  Resp 13 08/09/2018  5:14 PM  SpO2 100 % 08/09/2018  5:14 PM  Vitals shown include unvalidated device data.  Last Pain:  Vitals:   08/09/18 1259  TempSrc: Oral  PainSc: 0-No pain         Complications: No apparent anesthesia complications

## 2018-08-09 NOTE — Anesthesia Postprocedure Evaluation (Signed)
Anesthesia Post Note  Patient: Jacqueline Rush  Procedure(s) Performed: IRRIGATION AND DEBRIDEMENT KNEE WITH BURSA EXCISION (Left Knee)  Patient location during evaluation: PACU Anesthesia Type: General Level of consciousness: awake and alert Pain management: pain level controlled Vital Signs Assessment: post-procedure vital signs reviewed and stable Respiratory status: spontaneous breathing, nonlabored ventilation, respiratory function stable and patient connected to nasal cannula oxygen Cardiovascular status: blood pressure returned to baseline and stable Postop Assessment: no apparent nausea or vomiting Anesthetic complications: no     Last Vitals:  Vitals:   08/09/18 1729 08/09/18 1736  BP: (!) 139/93   Pulse: 70 87  Resp: 13 14  Temp:    SpO2: 97% 98%    Last Pain:  Vitals:   08/09/18 1736  TempSrc:   PainSc: [place]

## 2018-08-09 NOTE — Anesthesia Post-op Follow-up Note (Signed)
Anesthesia QCDR form completed.        

## 2018-08-10 ENCOUNTER — Encounter: Payer: Self-pay | Admitting: Surgery

## 2018-08-10 MED ORDER — VANCOMYCIN HCL IN DEXTROSE 1-5 GM/200ML-% IV SOLN
1000.0000 mg | Freq: Once | INTRAVENOUS | Status: DC
  Filled 2018-08-10: qty 200

## 2018-08-10 MED ORDER — VANCOMYCIN HCL IN DEXTROSE 1-5 GM/200ML-% IV SOLN: 1000.0000 mg | Freq: Once | INTRAVENOUS | Status: DC

## 2018-08-10 NOTE — Progress Notes (Signed)
  Subjective: 1 Day Post-Op Procedure(s) (LRB): IRRIGATION AND DEBRIDEMENT KNEE WITH BURSA EXCISION (Left) Patient reports pain as mild.   Patient is well, and has had no acute complaints or problems Plan is to go Home after hospital stay. Negative for chest pain and shortness of breath Fever: no Gastrointestinal:Negative for nausea and vomiting  Objective: Vital signs in last 24 hours: Temp:  [97.6 F (36.4 C)-99.5 F (37.5 C)] 97.7 F (36.5 C) (08/30 0344) Pulse Rate:  [62-96] 74 (08/30 0344) Resp:  [13-20] 18 (08/30 0344) BP: (110-151)/(58-99) 121/65 (08/30 0344) SpO2:  [95 %-100 %] 97 % (08/30 0344) Weight:  [56.7 kg] 56.7 kg (08/29 1259)  Intake/Output from previous day:  Intake/Output Summary (Last 24 hours) at 08/10/2018 0803 Last data filed at 08/10/2018 0616 Gross per 24 hour  Intake 1351.84 ml  Output 5 ml  Net 1346.84 ml    Intake/Output this shift: No intake/output data recorded.  Labs: No results for input(s): HGB in the last 72 hours. No results for input(s): WBC, RBC, HCT, PLT in the last 72 hours. No results for input(s): NA, K, CL, CO2, BUN, CREATININE, GLUCOSE, CALCIUM in the last 72 hours. No results for input(s): LABPT, INR in the last 72 hours.   EXAM General - Patient is Alert, Appropriate and Oriented Extremity - ABD soft Sensation intact distally Intact pulses distally Dorsiflexion/Plantar flexion intact Incision: dressing C/D/I No cellulitis present Dressing/Incision - clean, dry, no drainage, hemovac removed today and new dressing applied. Motor Function - intact, moving foot and toes well on exam.  Abdomen soft with normal BS.  Past Medical History:  Diagnosis Date  . Allergy   . Arthritis   . Family history of adverse reaction to anesthesia    dad had hiccups for 10 days after surgery  . Pre-diabetes   . Varicose veins     Assessment/Plan: 1 Day Post-Op Procedure(s) (LRB): IRRIGATION AND DEBRIDEMENT KNEE WITH BURSA EXCISION  (Left) Active Problems:   Septic prepatellar bursitis of left knee  Estimated body mass index is 24.41 kg/m as calculated from the following:   Height as of this encounter: 5' (1.524 m).   Weight as of this encounter: 56.7 kg. Advance diet Up with therapy D/C IV fluids   Up with therapy today. Hemovac removed today.   Remain in knee immobilizer. Upon discharge will start back on bactrim she was taking prior to surgery. Plan will be for discharge home today following after dose of IV Vanc.  DVT Prophylaxis - Lovenox, Foot Pumps and TED hose Weight-Bearing as tolerated to left leg, remain in knee immobilizer.  Raquel James, PA-C [facility] 08/10/2018, 8:03 AM

## 2018-08-10 NOTE — Progress Notes (Signed)
Discharge summary reviewed with verbal understanding. No Rxs at discharge. Drsg changed to [facility], provided one for home. Knee immobolizer in place. Escorted to personal vehicle via wc.

## 2018-08-10 NOTE — Care Management (Signed)
PT discussed with RNCM that patient has no home health needs at discharge. No home health orders in. Spoke with Cameron Proud, PA, he states per Dr. Roland Rack, no home health PT is needed and no Lovenox is needed. No further needs identified.

## 2018-08-10 NOTE — Evaluation (Signed)
Physical Therapy Evaluation Patient Details Name: Jacqueline Rush MRN: [id number] DOB: 1961-09-29 Today's Date: 08/10/2018   History of Present Illness  57 y/o female s/p I&D of L prepatellar bursitis.  Clinical Impression  Pt is able to perform all tasks w/o issue, she easily performed bed mobility, transfers, prolonged ambulation and stairs w/o assist and w/o difficulty.  KI donned the entire time but pt showed good strength and quality of motion in L LE.  Pt with very minimal pain and overall feels confident about going home.  No further PT needs here in the hospital and safe to d/c regarding PT.    Follow Up Recommendations Follow surgeon's recommendation for DC plan and follow-up therapies    Equipment Recommendations  None recommended by PT    Recommendations for Other Services       Precautions / Restrictions Precautions Required Braces or Orthoses: Knee Immobilizer - Left Restrictions LLE Weight Bearing: Weight bearing as tolerated      Mobility  Bed Mobility Overal bed mobility: Independent                Transfers Overall transfer level: Independent Equipment used: None             General transfer comment: Pt able to rise easily w/o AD  Ambulation/Gait Ambulation/Gait assistance: Independent Gait Distance (Feet): 250 Feet Assistive device: None       General Gait Details: Pt able to ambulate with good speed and confidence and showed good overall safety  Stairs Stairs: Yes Stairs assistance: Modified independent (Device/Increase time) Stair Management: No rails Number of Stairs: 4 General stair comments: Pt able to negotiate up/down steps with and w/o rails, showed good confidence, balance and safety  Wheelchair Mobility    Modified Rankin (Stroke Patients Only)       Balance Overall balance assessment: Independent                                           Pertinent Vitals/Pain Pain Assessment: 0-10 Pain  Score: 1  Pain Location: Pt reports she was initially having severe throbbing but that has subsided    Home Living Family/patient expects to be discharged to:: Private residence Living Arrangements: Spouse/significant other;Children(Pt will be staying with her mother) Available Help at Discharge: Family;Friend(s)   Home Access: Stairs to enter Entrance Stairs-Rails: None Entrance Stairs-Number of Steps: 3 Home Layout: Two level;Able to live on main level with bedroom/bathroom        Prior Function Level of Independence: Independent         Comments: 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, generally very active     Hand Dominance        Extremity/Trunk Assessment   Upper Extremity Assessment Upper Extremity Assessment: Overall WFL for tasks assessed    Lower Extremity Assessment Lower Extremity Assessment: Overall WFL for tasks assessed(able to do SLRs on L, showed good quad engagement)       Communication   Communication: No difficulties  Cognition Arousal/Alertness: Awake/alert Behavior During Therapy: WFL for tasks assessed/performed Overall Cognitive Status: Within Functional Limits for tasks assessed                                        General Comments      Exercises  Assessment/Plan    PT Assessment All further PT needs can be met in the next venue of care  PT Problem List Pain;Decreased mobility;Decreased strength;Decreased range of motion       PT Treatment Interventions Gait training;Therapeutic exercise;Therapeutic activities;Stair training;Functional mobility training;Balance training;Patient/family education    PT Goals (Current goals can be found in the Care Plan section)  Acute Rehab PT Goals Patient Stated Goal: go home PT Goal Formulation: With patient Time For Goal Achievement: 08/24/18 Potential to Achieve Goals: Good    Frequency Min 2X/week   Barriers to discharge        Co-evaluation                AM-PAC PT "6 Clicks" Daily Activity  Outcome Measure Difficulty turning over in bed (including adjusting bedclothes, sheets and blankets)?: None Difficulty moving from lying on back to sitting on the side of the bed? : None Difficulty sitting down on and standing up from a chair with arms (e.g., wheelchair, bedside commode, etc,.)?: None Help needed moving to and from a bed to chair (including a wheelchair)?: None Help needed walking in hospital room?: None Help needed climbing 3-5 steps with a railing? : None 6 Click Score: 24    End of Session Equipment Utilized During Treatment: Gait belt Activity Tolerance: Patient tolerated treatment well Patient left: with chair alarm set;with call bell/phone within reach Nurse Communication: Mobility status PT Visit Diagnosis: Muscle weakness (generalized) (M62.81);Difficulty in walking, not elsewhere classified (R26.2)    Time: 4696-2952 PT Time Calculation (min) (ACUTE ONLY): 25 min   Charges:   PT Evaluation $PT Eval Low Complexity: 1 Low          Kreg Shropshire, DPT 08/10/2018, 11:12 AM

## 2018-08-10 NOTE — Discharge Summary (Signed)
Physician Discharge Summary  Patient ID: TANEA MOGA MRN: [id number] DOB/AGE: January 06, 1961 57 y.o.  Admit date: 08/09/2018 Discharge date: 08/10/2018  Admission Diagnoses:  Infected left pre-patellar bursitis  Discharge Diagnoses: Patient Active Problem List   Diagnosis Date Noted  . Septic prepatellar bursitis of left knee 08/09/2018  . Sepsis due to cellulitis ([place]) 08/02/2018  . Seborrheic keratosis 02/27/2018  . Breast cancer screening 01/24/2017  . Post-traumatic osteoarthritis of left shoulder 07/01/2016  . Primary osteoarthritis of right knee 05/27/2016  . Complex tear of medial meniscus of right knee as current injury 05/16/2016  . Rotator cuff tendinitis, left 05/16/2016  . Tear of left glenoid labrum 05/16/2016  . Annual physical exam 12/09/2015  . Encounter for screening mammogram for malignant neoplasm of breast 12/09/2015  . Skin lesion 12/09/2015  . Plantar warts 12/09/2015  . Need for immunization against influenza 12/09/2015  . Arthritis, multiple joint involvement 12/01/2015  . Varicose vein 12/01/2015  . Allergic rhinitis 12/01/2015  . Prediabetes 12/01/2015    Past Medical History:  Diagnosis Date  . Allergy   . Arthritis   . Family history of adverse reaction to anesthesia    dad had hiccups for 10 days after surgery  . Pre-diabetes   . Varicose veins      Transfusion: None.   Consultants (if any):   Discharged Condition: Improved  Hospital Course: Jacqueline Rush is an 57 y.o. female who was admitted 08/09/2018 with a diagnosis of a infected left pre-patellar bursitis and went to the operating room on 08/09/2018 and underwent the above named procedures.    Surgeries: Procedure(s): IRRIGATION AND DEBRIDEMENT KNEE WITH BURSA EXCISION on 08/09/2018 Patient tolerated the surgery well. Taken to PACU where she was stabilized and then transferred to the orthopedic floor.  Started on Lovenox 40mg  q 24 hrs. Foot pumps applied bilaterally at 80  mm. Heels elevated on bed with rolled towels. No evidence of DVT. Negative Homan. Physical therapy started on day #1 for gait training and transfer.  Patient's IV and Hemovac were both removed on POD1.  Implants: None.  She was given perioperative antibiotics:  Anti-infectives (From admission, onward)   Start     Dose/Rate Route Frequency Ordered Stop   08/10/18 1815  vancomycin (VANCOCIN) IVPB 1000 mg/200 mL premix  Status:  Discontinued     1,000 mg 200 mL/hr over 60 Minutes Intravenous  Once 08/10/18 0745 08/10/18 0746   08/09/18 1830  vancomycin (VANCOCIN) IVPB 1000 mg/200 mL premix     1,000 mg 200 mL/hr over 60 Minutes Intravenous Every 12 hours 08/09/18 1817 08/10/18 1829   08/09/18 1315  ceFAZolin (ANCEF) IVPB 2g/100 mL premix     2 g 200 mL/hr over 30 Minutes Intravenous  Once 08/09/18 1303 08/09/18 1627   08/09/18 1252  ceFAZolin (ANCEF) 2-4 GM/100ML-% IVPB    Note to Pharmacy:  Norton Blizzard  : cabinet override      08/09/18 1252 08/09/18 1612    .  She was given sequential compression devices, early ambulation, and Lovenox for DVT prophylaxis.  She benefited maximally from the hospital stay and there were no complications.    Recent vital signs:  Vitals:   08/09/18 2330 08/10/18 0344  BP: 110/66 121/65  Pulse: 74 74  Resp: 19 18  Temp: 99.1 F (37.3 C) 97.7 F (36.5 C)  SpO2: 97% 97%    Recent laboratory studies:  Lab Results  Component Value Date   HGB 12.4 08/07/2018   HGB 12.2  08/06/2018   HGB 11.9 (L) 08/05/2018   Lab Results  Component Value Date   WBC 10.3 08/07/2018   PLT 354 08/07/2018   No results found for: INR Lab Results  Component Value Date   NA 144 08/07/2018   K 3.8 08/07/2018   CL 109 08/07/2018   CO2 29 08/07/2018   BUN 13 08/07/2018   CREATININE 0.67 08/07/2018   GLUCOSE 96 08/07/2018    Discharge Medications:   Allergies as of 08/10/2018      Reactions   Iodinated Diagnostic Agents Swelling   Contrast dye- facial  swelling   Codeine Other (See Comments)      Medication List    TAKE these medications   acetaminophen 500 MG tablet Commonly known as:  TYLENOL Take 500 mg by mouth every 6 (six) hours as needed for mild pain or moderate pain.   acidophilus Caps capsule Take 1 capsule by mouth daily.   oxyCODONE 5 MG immediate release tablet Commonly known as:  Oxy IR/ROXICODONE Take 1 tablet (5 mg total) by mouth every 6 (six) hours as needed for moderate pain or severe pain.   predniSONE 20 MG tablet Commonly known as:  DELTASONE Take 2 tablets (40 mg total) by mouth daily for 6 days.   sulfamethoxazole-trimethoprim 800-160 MG tablet Commonly known as:  BACTRIM DS,SEPTRA DS Take 1 tablet by mouth 2 (two) times daily for 10 days.       Diagnostic Studies: Dg Chest 2 View  Result Date: 08/03/2018 CLINICAL DATA:  Wheezing. EXAM: CHEST - 2 VIEW COMPARISON:  Radiograph of September 08, 2006. FINDINGS: The heart size and mediastinal contours are within normal limits. Both lungs are clear. No pneumothorax or pleural effusion is noted. The visualized skeletal structures are unremarkable. IMPRESSION: No active cardiopulmonary disease. Electronically Signed   By: Marijo Conception, M.D.   On: 08/03/2018 15:08   Dg Knee Complete 4 Views Left  Result Date: 08/02/2018 CLINICAL DATA:  Infected LEFT knee, on Bactrim, LEFT knee read with small abscess, painful to ambulate EXAM: LEFT KNEE - COMPLETE 4+ VIEW COMPARISON:  None FINDINGS: Scattered soft tissue swelling greatest anteriorly. Low normal osseous mineralization. Joint spaces preserved. Small corticated ossicles at posterior intercondylar region. No acute fracture, dislocation, or bone destruction. No knee joint effusion. IMPRESSION: No acute osseous abnormalities. Soft tissue swelling greatest anteriorly. Electronically Signed   By: Lavonia Dana M.D.   On: 08/02/2018 18:06   Disposition: Plan will be for discharge home today after afternoon dose of  Vancomycin.  Follow-up Information    Lattie Corns, PA-C. Schedule an appointment as soon as possible for a visit on 08/20/2018.   Specialty:  Physician Assistant Why:  Electa Sniff information: [place] [address] [phone]          Signed: Judson Roch PA-C 08/10/2018, 8:08 AM

## 2018-08-10 NOTE — Progress Notes (Signed)
Clinical Social Worker (CSW) received SNF consult. PT is recommending per surgeon. RN case manager aware of above. Please reconsult if future social work needs arise. CSW signing off.   Aamya Orellana, LCSW [phone]  

## 2018-08-10 NOTE — Discharge Instructions (Signed)
-  Keep dressing dry. -Remain in knee immobilizer. -Weight bear as tolerated. -Take antibiotics as prescribed.  Pain medication as needed. -Call clinic if any questions arise.

## 2018-08-14 LAB — AEROBIC/ANAEROBIC CULTURE W GRAM STAIN (SURGICAL/DEEP WOUND)

## 2018-08-14 LAB — AEROBIC/ANAEROBIC CULTURE (SURGICAL/DEEP WOUND)

## 2018-08-17 ENCOUNTER — Encounter: Payer: Self-pay | Admitting: Surgery

## 2018-08-24 ENCOUNTER — Encounter: Payer: Self-pay | Admitting: Family Medicine

## 2018-08-24 ENCOUNTER — Ambulatory Visit (INDEPENDENT_AMBULATORY_CARE_PROVIDER_SITE_OTHER): Payer: Self-pay | Admitting: Family Medicine

## 2018-08-24 VITALS — BP 124/70 | HR 90 | Temp 99.1°F | Ht 60.0 in | Wt 127.8 lb

## 2018-08-24 DIAGNOSIS — L03116 Cellulitis of left lower limb: Secondary | ICD-10-CM

## 2018-08-24 DIAGNOSIS — M71162 Other infective bursitis, left knee: Secondary | ICD-10-CM

## 2018-08-24 NOTE — Patient Instructions (Addendum)
Please do eat yogurt or kimchi or take a probiotic daily for the next month We want to replace the healthy germs in the gut If you notice foul, watery diarrhea in the next two months, schedule an appointment RIGHT AWAY or go to an urgent care or the emergency room if a holiday or over a weekend Do go to the ER if you think you are getting a blood clot in the leg

## 2018-08-24 NOTE — Progress Notes (Signed)
BP 124/70   Pulse 90   Temp 99.1 F (37.3 C)   Ht 5' (1.524 m)   Wt 127 lb 12.8 oz (58 kg)   SpO2 97%   BMI 24.96 kg/m    Subjective:    Patient ID: Jacqueline Rush, female    DOB: June 08, 1961, 57 y.o.   MRN: [id number]  HPI: Jacqueline Rush is a 57 y.o. female  Chief Complaint  Patient presents with  . Hospitalization Follow-up    HPI She is here for hospital f/u  About four weeks ago, at the gym; felt something like a pinprick or bite; maybe was bitten Just annoying, could still walk around, full ROM, but if she messed with it, very sensitive and very sore By Wednesday, had really gotten hot and red; very sore to touch; then came to see Raelyn Ensign, NP here on August 21st She put her on Bactrim, still nothing open and oozing, though By that evening, was getting much worse She went to the ER; could not even walk on the left leg because of the throbbing; significantly worse really fast; was running a fever Admitted to the hospital; tried to lance it and nothing came out; gave her clinda and ceftriaxone in the ER, switched to vanc; then spent five days in the hospital, on vanc and rocephin Ortho was monitoring her WBCs were very high at first Steroids helped with the pain and inflammation Went to ortho two later for f/u and they took her for surgery; admitted August 29th and discharged on Aug 30th; spent another week out of work recovering Got staples out on Monday, got steri-strips Reviewed note from 08/20/18, just finished bactrim that day, staples were intact and they were removed that day He cleaned out a grape sized area of infection, took the bursa sac out No fevers now Pacing herself, back at work; f/u with ortho in October Did not get the Rx probiotics, $35, he ordered another OTC probiotics that you keep refrigerated; took that back; then used a gummy, bowels were a little messed up; stopped that again; now sort of some mucous and loose stools; things are not  normal; two weeks after surgery and just getting back to normal; was more bloated in the hospital, not bloated now Did not get a yeast infection  Depression screen [facility] 2/9 08/24/2018 08/01/2018 02/27/2018 01/29/2018 01/24/2017  Decreased Interest 0 0 0 0 0  Down, Depressed, Hopeless 0 0 0 0 0  PHQ - 2 Score 0 0 0 0 0  Altered sleeping - 0 - - -  Tired, decreased energy - 0 - - -  Change in appetite - 0 - - -  Feeling bad or failure about yourself  - 0 - - -  Trouble concentrating - 0 - - -  Moving slowly or fidgety/restless - 0 - - -  Suicidal thoughts - 0 - - -  PHQ-9 Score - 0 - - -  Difficult doing work/chores - Not difficult at all - - -    Relevant past medical, surgical, family and social history reviewed Past Medical History:  Diagnosis Date  . Allergy   . Arthritis   . Family history of adverse reaction to anesthesia    dad had hiccups for 10 days after surgery  . Pre-diabetes   . Varicose veins    Past Surgical History:  Procedure Laterality Date  . BREAST CYST ASPIRATION Left   . CESAREAN SECTION    . EXTERNAL EAR  SURGERY     pt states had right ear surgery  . IRRIGATION AND DEBRIDEMENT KNEE Left 08/09/2018   Procedure: IRRIGATION AND DEBRIDEMENT KNEE WITH BURSA EXCISION;  Surgeon: Corky Mull, MD;  Location: [facility];  Service: Orthopedics;  Laterality: Left;  . KNEE ARTHROSCOPY WITH MEDIAL MENISECTOMY Right 06/01/2016   Procedure: Arthroscopic partial medial meniscectomy, arthroscopic abrasion chondroplasty of femoral trochlea, and debridement of symptomatic plica, right knee.;  Surgeon: Corky Mull, MD;  Location: [place];  Service: Orthopedics;  Laterality: Right;  . SHOULDER ARTHROSCOPY WITH DEBRIDEMENT AND BICEP TENDON REPAIR Left 06/30/2016   Procedure: SHOULDER ARTHROSCOPY WITH DEBRIDEMENT AND BICEP TENDON REPAIR;  Surgeon: Corky Mull, MD;  Location: [facility];  Service: Orthopedics;  Laterality: Left;  . SHOULDER ARTHROSCOPY WITH LABRAL REPAIR Left  06/30/2016   Procedure: SHOULDER ARTHROSCOPY WITH LABRAL REPAIR;  Surgeon: Corky Mull, MD;  Location: [facility];  Service: Orthopedics;  Laterality: Left;   Family History  Problem Relation Age of Onset  . Cancer Father        colon, prostate, skin  . Diabetes Father   . Colon cancer Father   . Prostate cancer Father   . Ulcerative colitis Daughter   . Hypertension Mother   . Heart disease Mother        heart attack in 2017  . Cancer Maternal Uncle        prostate and colon  . Colon cancer Maternal Uncle   . Prostate cancer Maternal Uncle   . Stroke Maternal Grandmother   . Heart disease Maternal Grandfather        died from a heart attack  . Cancer Paternal Grandfather        colon  . Colon cancer Paternal Grandfather    Social History   Tobacco Use  . Smoking status: Never Smoker  . Smokeless tobacco: Never Used  Substance Use Topics  . Alcohol use: Yes    Alcohol/week: 0.0 standard drinks    Comment: occasional  . Drug use: No    Interim medical history since last visit reviewed. Allergies and medications reviewed  Review of Systems Per HPI unless specifically indicated above     Objective:    BP 124/70   Pulse 90   Temp 99.1 F (37.3 C)   Ht 5' (1.524 m)   Wt 127 lb 12.8 oz (58 kg)   SpO2 97%   BMI 24.96 kg/m   Wt Readings from Last 3 Encounters:  08/24/18 127 lb 12.8 oz (58 kg)  08/09/18 125 lb (56.7 kg)  08/02/18 125 lb (56.7 kg)    Physical Exam  Constitutional: She appears well-developed and well-nourished.  HENT:  Mouth/Throat: Mucous membranes are normal.  Eyes: EOM are normal. No scleral icterus.  Cardiovascular: Normal rate and regular rhythm.  Pulmonary/Chest: Effort normal and breath sounds normal.  Musculoskeletal:       Left knee: She exhibits swelling.  Healing incision site; no drainage; LEFT knee anteriorly  Psychiatric: She has a normal mood and affect. Her behavior is normal.      Assessment & Plan:   Problem List Items  Addressed This Visit    None    Visit Diagnoses    Cellulitis of left knee    -  Primary   managed by ortho; no fever today; she continues to improve (based on her report and pictures)   Infected prepatellar bursa, left       status post surgery; she continues  to improve based on report and pictures       Follow up plan: No follow-ups on file.  An after-visit summary was printed and given to the patient at [facility].  Please see the patient instructions which may contain other information and recommendations beyond what is mentioned above in the assessment and plan.  No orders of the defined types were placed in this encounter.   No orders of the defined types were placed in this encounter.

## 2018-10-01 ENCOUNTER — Telehealth: Payer: Self-pay | Admitting: Family Medicine

## 2018-10-01 NOTE — Telephone Encounter (Signed)
Left detailed voicemail

## 2018-10-01 NOTE — Telephone Encounter (Signed)
I'm going through old orders Ask patient if she is interested in getting her labs done that were ordered at her physical in March She does not need the CBC or CMP again, so those can be canceled

## 2018-12-06 ENCOUNTER — Telehealth: Payer: Self-pay | Admitting: Family Medicine

## 2018-12-06 NOTE — Telephone Encounter (Signed)
I'm going through old orders today Please follow-up on multiple labs that were to be drawn in March It looks like they were collected, but I do not see results Please follow-up on that

## 2018-12-10 NOTE — Telephone Encounter (Signed)
Those are outstanding labs Can you ask the patient to return for them please? Thank you

## 2018-12-10 NOTE — Telephone Encounter (Signed)
Per [facility] lab, pt was not fasting on 02/27/18 and bloodwork was not completed.

## 2018-12-11 NOTE — Telephone Encounter (Signed)
Left detailed VM.  

## 2019-03-06 ENCOUNTER — Encounter: Payer: Self-pay | Admitting: Family Medicine

## 2019-06-06 ENCOUNTER — Encounter: Payer: Self-pay | Admitting: Family Medicine

## 2020-03-06 ENCOUNTER — Ambulatory Visit: Payer: Self-pay | Attending: Internal Medicine

## 2020-03-06 DIAGNOSIS — Z23 Encounter for immunization: Secondary | ICD-10-CM

## 2020-03-06 NOTE — Progress Notes (Signed)
   Covid-19 Vaccination Clinic  Name:  Jacqueline Rush    MRN: [id number] DOB: 1961-06-12  03/06/2020  [name] was observed post Covid-19 immunization for 30 minutes based on pre-vaccination screening without incident. She was provided with Vaccine Information Sheet and instruction to access the V-Safe system.   [name] was instructed to call 911 with any severe reactions post vaccine: Marland Kitchen Difficulty breathing  . Swelling of face and throat  . A fast heartbeat  . A bad rash all over body  . Dizziness and weakness   Immunizations Administered    Name Date Dose VIS Date Route   Pfizer COVID-19 Vaccine 03/06/2020  3:58 PM 0.3 mL 11/22/2019 Intramuscular   Manufacturer: [place]   Lot: [id number]   [place]: [id number]

## 2020-03-31 ENCOUNTER — Ambulatory Visit: Payer: Self-pay | Attending: Internal Medicine

## 2020-03-31 DIAGNOSIS — Z23 Encounter for immunization: Secondary | ICD-10-CM

## 2020-03-31 NOTE — Progress Notes (Signed)
   Covid-19 Vaccination Clinic  Name:  ANIK LUEVANO    MRN: [id number] DOB: 08/07/1961  03/31/2020  Ms. Duling was observed post Covid-19 immunization for 15 minutes without incident. She was provided with Vaccine Information Sheet and instruction to access the V-Safe system.   Ms. Boye was instructed to call 911 with any severe reactions post vaccine: Marland Kitchen Difficulty breathing  . Swelling of face and throat  . A fast heartbeat  . A bad rash all over body  . Dizziness and weakness   Immunizations Administered    Name Date Dose VIS Date Route   Pfizer COVID-19 Vaccine 03/31/2020  4:34 PM 0.3 mL 02/05/2019 Intramuscular   Manufacturer: [place]   Lot: [id number]   [place]: [id number]

## 2021-09-22 ENCOUNTER — Encounter: Payer: Self-pay | Admitting: Nurse Practitioner

## 2021-09-22 ENCOUNTER — Other Ambulatory Visit: Payer: Self-pay | Admitting: Nurse Practitioner

## 2021-09-22 ENCOUNTER — Other Ambulatory Visit (HOSPITAL_COMMUNITY)
Admission: RE | Admit: 2021-09-22 | Discharge: 2021-09-22 | Disposition: A | Discharge status: Home / Self Care | Payer: 59 | Source: Ambulatory Visit | Attending: Family Medicine | Admitting: Family Medicine

## 2021-09-22 ENCOUNTER — Ambulatory Visit (INDEPENDENT_AMBULATORY_CARE_PROVIDER_SITE_OTHER): Payer: 59 | Admitting: Nurse Practitioner

## 2021-09-22 ENCOUNTER — Ambulatory Visit: Payer: Self-pay | Admitting: Family Medicine

## 2021-09-22 ENCOUNTER — Other Ambulatory Visit: Payer: Self-pay

## 2021-09-22 VITALS — BP 124/72 | HR 89 | Temp 98.7°F | Resp 16 | Ht 60.0 in | Wt 131.6 lb

## 2021-09-22 DIAGNOSIS — Z1231 Encounter for screening mammogram for malignant neoplasm of breast: Secondary | ICD-10-CM | POA: Diagnosis not present

## 2021-09-22 DIAGNOSIS — Z23 Encounter for immunization: Secondary | ICD-10-CM | POA: Diagnosis not present

## 2021-09-22 DIAGNOSIS — Z130 Encounter for screening for diseases of the blood and blood-forming organs and certain disorders involving the immune mechanism: Secondary | ICD-10-CM

## 2021-09-22 DIAGNOSIS — Z131 Encounter for screening for diabetes mellitus: Secondary | ICD-10-CM

## 2021-09-22 DIAGNOSIS — Z0000 Encounter for general adult medical examination without abnormal findings: Secondary | ICD-10-CM | POA: Diagnosis not present

## 2021-09-22 DIAGNOSIS — Z1211 Encounter for screening for malignant neoplasm of colon: Secondary | ICD-10-CM

## 2021-09-22 DIAGNOSIS — Z124 Encounter for screening for malignant neoplasm of cervix: Secondary | ICD-10-CM

## 2021-09-22 DIAGNOSIS — D229 Melanocytic nevi, unspecified: Secondary | ICD-10-CM

## 2021-09-22 DIAGNOSIS — Z13220 Encounter for screening for lipoid disorders: Secondary | ICD-10-CM

## 2021-09-22 NOTE — Progress Notes (Addendum)
Name: Jacqueline Rush   MRN: [id number]    DOB: 01/28/1961   Date:09/22/2021       Progress Note  Subjective  Chief Complaint  Physical   HPI  Patient presents for annual CPE, here alone  Diet: She likes popcorn, and milk duds.  She does eat vegetables.  She does get in her dairy. 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[employer], very physically active  Flowsheet Row Office Visit from 09/22/2021 in [facility]  AUDIT-C Score 0      Depression: Phq 9 is  negative Depression screen PHQ 2/9 09/22/2021 08/24/2018 08/01/2018 02/27/2018 01/29/2018  Decreased Interest 0 0 0 0 0  Down, Depressed, Hopeless 0 0 0 0 0  PHQ - 2 Score 0 0 0 0 0  Altered sleeping 0 - 0 - -  Tired, decreased energy 0 - 0 - -  Change in appetite 0 - 0 - -  Feeling bad or failure about yourself  0 - 0 - -  Trouble concentrating 0 - 0 - -  Moving slowly or fidgety/restless 0 - 0 - -  Suicidal thoughts 0 - 0 - -  PHQ-9 Score 0 - 0 - -  Difficult doing work/chores Not difficult at all - Not difficult at all - -   Hypertension: BP Readings from Last 3 Encounters:  09/22/21 124/72  08/24/18 124/70  08/10/18 (!) 131/93   Obesity: Wt Readings from Last 3 Encounters:  09/22/21 131 lb 9.6 oz (59.7 kg)  08/24/18 127 lb 12.8 oz (58 kg)  08/09/18 125 lb (56.7 kg)   BMI Readings from Last 3 Encounters:  09/22/21 25.70 kg/m  08/24/18 24.96 kg/m  08/09/18 24.41 kg/m     Vaccines:  Shingrix: 50-64 yo and ask insurance if covered when patient above 65 yo Pneumonia: educated and discussed with patient. Flu: educated and discussed with patient, given today  Hep C Screening: 01/2017 STD testing and prevention (HIV/chl/gon/syphilis): 01/2017 Intimate partner violence: none Sexual History : sexually active, husband, some vaginal dryness, educated on lubricant Menstrual History/LMP/Abnormal Bleeding: post menopausal, discussed to watch for abnormal bleeding. Incontinence Symptoms: stress  incontinence when jumping rope only  Breast cancer:  - Last Mammogram: 01/04/2016, ordered today - BRCA gene screening: not done  Osteoporosis: Discussed high calcium and vitamin D supplementation, weight bearing exercises  Cervical cancer screening: 02/27/2018,  PAP today  Skin cancer: Discussed monitoring for atypical lesions, has large abnormal mole on left thigh, referred to derm Colorectal cancer:  01/19/2010, referred to GI Lung cancer:  Low Dose CT Chest recommended if Age 50-80 years, 20 pack-year currently smoking OR have quit w/in 15years. Patient does not qualify.   ECG: 09/23/2014  Advanced Care Planning: A voluntary discussion about advance care planning including the explanation and discussion of advance directives.  Discussed health care proxy and Living will, and the patient was able to identify a health care proxy as husband.    Lipids: Lab Results  Component Value Date   CHOL 189 01/24/2017   CHOL 174 12/02/2015   Lab Results  Component Value Date   HDL 107 01/24/2017   HDL 89 12/02/2015   Lab Results  Component Value Date   LDLCALC 68 01/24/2017   LDLCALC 73 12/02/2015   Lab Results  Component Value Date   TRIG 68 01/24/2017   TRIG 61 12/02/2015   Lab Results  Component Value Date   CHOLHDL 1.8 01/24/2017   CHOLHDL 2.0 12/02/2015   No results found for:   LDLDIRECT  Glucose: Glucose  Date Value Ref Range Status  09/23/2014 85 65 - 99 mg/dL Final   Glucose, Bld  Date Value Ref Range Status  08/07/2018 96 70 - 99 mg/dL Final  08/04/2018 140 (H) 70 - 99 mg/dL Final  08/03/2018 130 (H) 70 - 99 mg/dL Final   Glucose-Capillary  Date Value Ref Range Status  08/09/2018 103 (H) 70 - 99 mg/dL Final  08/09/2018 102 (H) 70 - 99 mg/dL Final    Patient Active Problem List   Diagnosis Date Noted   Septic prepatellar bursitis of left knee 08/09/2018   Sepsis due to cellulitis (HCC) 08/02/2018   Seborrheic keratosis 02/27/2018   Breast cancer screening  01/24/2017   Post-traumatic osteoarthritis of left shoulder 07/01/2016   Primary osteoarthritis of right knee 05/27/2016   Complex tear of medial meniscus of right knee as current injury 05/16/2016   Rotator cuff tendinitis, left 05/16/2016   Tear of left glenoid labrum 05/16/2016   Annual physical exam 12/09/2015   Encounter for screening mammogram for malignant neoplasm of breast 12/09/2015   Skin lesion 12/09/2015   Plantar warts 12/09/2015   Need for immunization against influenza 12/09/2015   Arthritis, multiple joint involvement 12/01/2015   Varicose vein 12/01/2015   Allergic rhinitis 12/01/2015   Prediabetes 12/01/2015    Past Surgical History:  Procedure Laterality Date   BREAST CYST ASPIRATION Left    CESAREAN SECTION     EXTERNAL EAR SURGERY     pt states had right ear surgery   IRRIGATION AND DEBRIDEMENT KNEE Left 08/09/2018   Procedure: IRRIGATION AND DEBRIDEMENT KNEE WITH BURSA EXCISION;  Surgeon: Poggi, John J, MD;  Location: [facility];  Service: Orthopedics;  Laterality: Left;   KNEE ARTHROSCOPY WITH MEDIAL MENISECTOMY Right 06/01/2016   Procedure: Arthroscopic partial medial meniscectomy, arthroscopic abrasion chondroplasty of femoral trochlea, and debridement of symptomatic plica, right knee.;  Surgeon: John J Poggi, MD;  Location: [facility];  Service: Orthopedics;  Laterality: Right;   SHOULDER ARTHROSCOPY WITH DEBRIDEMENT AND BICEP TENDON REPAIR Left 06/30/2016   Procedure: SHOULDER ARTHROSCOPY WITH DEBRIDEMENT AND BICEP TENDON REPAIR;  Surgeon: John J Poggi, MD;  Location: [facility];  Service: Orthopedics;  Laterality: Left;   SHOULDER ARTHROSCOPY WITH LABRAL REPAIR Left 06/30/2016   Procedure: SHOULDER ARTHROSCOPY WITH LABRAL REPAIR;  Surgeon: John J Poggi, MD;  Location: [facility];  Service: Orthopedics;  Laterality: Left;    Family History  Problem Relation Age of Onset   Cancer Father        colon, prostate, skin   Diabetes Father    Colon cancer  Father    Prostate cancer Father    Ulcerative colitis Daughter    Hypertension Mother    Heart disease Mother        heart attack in 2017   Cancer Maternal Uncle        prostate and colon   Colon cancer Maternal Uncle    Prostate cancer Maternal Uncle    Stroke Maternal Grandmother    Heart disease Maternal Grandfather        died from a heart attack   Cancer Paternal Grandfather        colon   Colon cancer Paternal Grandfather     Social History   Socioeconomic History   Marital status: Married    Spouse name: John   Number of children: Not on file   Years of education: Not on file   Highest education level: Not on   file  Occupational History   Not on file  Tobacco Use   Smoking status: Never   Smokeless tobacco: Never  Vaping Use   Vaping Use: Never used  Substance and Sexual Activity   Alcohol use: Yes    Alcohol/week: 0.0 standard drinks    Comment: occasional   Drug use: No   Sexual activity: Yes    Partners: Male  Other Topics Concern   Not on file  Social History Narrative   Not on file   Social Determinants of Health   Financial Resource Strain: Low Risk    Difficulty of Paying Living Expenses: Not hard at all  Food Insecurity: No Food Insecurity   Worried About Charity fundraiser in the Last Year: Never true   [place] in the Last Year: Never true  Transportation Needs: No Transportation Needs   Lack of Transportation (Medical): No   Lack of Transportation (Non-Medical): No  Physical Activity: Sufficiently Active   Days of Exercise per Week: 5 days   Minutes of Exercise per Session: 60 min  Stress: No Stress Concern Present   Feeling of Stress : Only a little  Social Connections: Unknown   Frequency of Communication with Friends and Family: More than three times a week   Frequency of Social Gatherings with Friends and Family: Three times a week   Attends Religious Services: Patient refused   Active Member of Clubs or Organizations: Yes    Attends Archivist Meetings: 1 to 4 times per year   Marital Status: Married  Human resources officer Violence: Not At Risk   Fear of Current or Ex-Partner: No   Emotionally Abused: No   Physically Abused: No   Sexually Abused: No    No current outpatient medications on file.  Allergies  Allergen Reactions   Iodinated Diagnostic Agents Swelling    Contrast dye- facial swelling   Codeine Other (See Comments)     ROS  Constitutional: Negative for fever or weight change.  Respiratory: Negative for cough and shortness of breath.   Cardiovascular: Negative for chest pain or palpitations.  Gastrointestinal: Negative for abdominal pain, no bowel changes.  Musculoskeletal: Negative for gait problem or joint swelling.  Skin: Negative for rash.  Neurological: Negative for dizziness or headache.  No other specific complaints in a complete review of systems (except as listed in HPI above).   Objective  Vitals:   09/22/21 1105  BP: 124/72  Pulse: 89  Resp: 16  Temp: 98.7 F (37.1 C)  TempSrc: Oral  SpO2: 96%  Weight: 131 lb 9.6 oz (59.7 kg)  Height: 5' (1.524 m)    Body mass index is 25.7 kg/m.  Physical Exam Constitutional: Patient appears well-developed and well-nourished. No distress.  HENT: Head: Normocephalic and atraumatic. Ears: B TMs ok, no erythema or effusion; Nose: Nose normal. Mouth/Throat: not done Eyes: Conjunctivae and EOM are normal. Pupils are equal, round, and reactive to light. No scleral icterus.  Neck: Normal range of motion. Neck supple. No JVD present. No thyromegaly present.  Cardiovascular: Normal rate, regular rhythm and normal heart sounds.  No murmur heard. No BLE edema. Pulmonary/Chest: Effort normal and breath sounds normal. No respiratory distress. Abdominal: Soft. Bowel sounds are normal, no distension. There is no tenderness. no masses Breast: no lumps or masses, no nipple discharge or rashes FEMALE GENITALIA:  External genitalia  normal External urethra normal Vaginal vault normal without discharge or lesions Cervix normal without discharge or lesions Bimanual exam  normal without masses RECTAL: not done Musculoskeletal: Normal range of motion, no joint effusions. No gross deformities Neurological: he is alert and oriented to person, place, and time. No cranial nerve deficit. Coordination, balance, strength, speech and gait are normal.  Skin: Skin is warm and dry. No rash noted. No erythema. Large irregular mole noted to left thigh Psychiatric: Patient has a normal mood and affect. behavior is normal. Judgment and thought content normal.   No results found for this or any previous visit (from the past 2160 hour(s)).   Fall Risk: Fall Risk  09/22/2021 08/24/2018 08/01/2018 02/27/2018 01/29/2018  Falls in the past year? 0 No No No No  Number falls in past yr: 0 - - - -  Injury with Fall? 0 - - - -  Follow up Falls evaluation completed - - - -     Functional Status Survey: Is the patient deaf or have difficulty hearing?: No Does the patient have difficulty seeing, even when wearing glasses/contacts?: No Does the patient have difficulty concentrating, remembering, or making decisions?: No Does the patient have difficulty walking or climbing stairs?: No Does the patient have difficulty dressing or bathing?: No Does the patient have difficulty doing errands alone such as visiting a doctor's office or shopping?: No   Assessment & Plan  1. Annual physical exam  - MM Digital Screening; Future - Ambulatory referral to Gastroenterology - Lipid panel - CBC with Differential/Platelet - COMPLETE METABOLIC PANEL WITH GFR - Hemoglobin A1c  2. Need for influenza vaccination  - Flu Vaccine QUAD 6+ mos PF IM (Fluarix Quad PF)  3. Screening for diabetes mellitus  - COMPLETE METABOLIC PANEL WITH GFR - Hemoglobin A1c  4. Screening for cholesterol level  - Lipid panel  5. Screening for deficiency anemia  - CBC  with Differential/Platelet  6. Screening for colon cancer  - Ambulatory referral to Gastroenterology  7. Encounter for screening mammogram for malignant neoplasm of breast  - MM Digital Screening; Future  8. Screening for cervical cancer  - Cytology - PAP  9. Atypical mole  - Ambulatory referral to Dermatology   -USPSTF grade A and B recommendations reviewed with patient; age-appropriate recommendations, preventive care, screening tests, etc discussed and encouraged; healthy living encouraged; see AVS for patient education given to patient -Discussed importance of 150 minutes of physical activity weekly, eat two servings of fish weekly, eat one serving of tree nuts ( cashews, pistachios, pecans, almonds.Marland Kitchen) every other day, eat 6 servings of fruit/vegetables daily and drink plenty of water and avoid sweet beverages.

## 2021-09-22 NOTE — Patient Instructions (Signed)
Preventive Care 40-58 Years Old, Female Preventive care refers to lifestyle choices and visits with your health care provider that can promote health and wellness. This includes: A yearly physical exam. This is also called an annual wellness visit. Regular dental and eye exams. Immunizations. Screening for certain conditions. Healthy lifestyle choices, such as: Eating a healthy diet. Getting regular exercise. Not using drugs or products that contain nicotine and tobacco. Limiting alcohol use. What can I expect for my preventive care visit? Physical exam Your health care provider will check your: Height and weight. These may be used to calculate your BMI (body mass index). BMI is a measurement that tells if you are at a healthy weight. Heart rate and blood pressure. Body temperature. Skin for abnormal spots. Counseling Your health care provider may ask you questions about your: Past medical problems. Family's medical history. Alcohol, tobacco, and drug use. Emotional well-being. Home life and relationship well-being. Sexual activity. Diet, exercise, and sleep habits. Work and work environment. Access to firearms. Method of birth control. Menstrual cycle. Pregnancy history. What immunizations do I need? Vaccines are usually given at various ages, according to a schedule. Your health care provider will recommend vaccines for you based on your age, medical history, and lifestyle or other factors, such as travel or where you work. What tests do I need? Blood tests Lipid and cholesterol levels. These may be checked every 5 years, or more often if you are over 50 years old. Hepatitis C test. Hepatitis B test. Screening Lung cancer screening. You may have this screening every year starting at age 55 if you have a 30-pack-year history of smoking and currently smoke or have quit within the past 15 years. Colorectal cancer screening. All adults should have this screening starting at  age 50 and continuing until age 75. Your health care provider may recommend screening at age 45 if you are at increased risk. You will have tests every 1-10 years, depending on your results and the type of screening test. Diabetes screening. This is done by checking your blood sugar (glucose) after you have not eaten for a while (fasting). You may have this done every 1-3 years. Mammogram. This may be done every 1-2 years. Talk with your health care provider about when you should start having regular mammograms. This may depend on whether you have a family history of breast cancer. BRCA-related cancer screening. This may be done if you have a family history of breast, ovarian, tubal, or peritoneal cancers. Pelvic exam and Pap test. This may be done every 3 years starting at age 21. Starting at age 30, this may be done every 5 years if you have a Pap test in combination with an HPV test. Other tests STD (sexually transmitted disease) testing, if you are at risk. Bone density scan. This is done to screen for osteoporosis. You may have this scan if you are at high risk for osteoporosis. Talk with your health care provider about your test results, treatment options, and if necessary, the need for more tests. Follow these instructions at home: Eating and drinking  Eat a diet that includes fresh fruits and vegetables, whole grains, lean protein, and low-fat dairy products. Take vitamin and mineral supplements as recommended by your health care provider. Do not drink alcohol if: Your health care provider tells you not to drink. You are pregnant, may be pregnant, or are planning to become pregnant. If you drink alcohol: Limit how much you have to 0-1 drink a day. Be   aware of how much alcohol is in your drink. In the [place] one drink equals one 12 oz bottle of beer (355 mL), one 5 oz glass of wine (148 mL), or one 1 oz glass of hard liquor (44 mL). Lifestyle Take daily care of your teeth and  gums. Brush your teeth every morning and night with fluoride toothpaste. Floss one time each day. Stay active. Exercise for at least 30 minutes 5 or more days each week. Do not use any products that contain nicotine or tobacco, such as cigarettes, e-cigarettes, and chewing tobacco. If you need help quitting, ask your health care provider. Do not use drugs. If you are sexually active, practice safe sex. Use a condom or other form of protection to prevent STIs (sexually transmitted infections). If you do not wish to become pregnant, use a form of birth control. If you plan to become pregnant, see your health care provider for a prepregnancy visit. If told by your health care provider, take low-dose aspirin daily starting at age 63. Find healthy ways to cope with stress, such as: Meditation, yoga, or listening to music. Journaling. Talking to a trusted person. Spending time with friends and family. Safety Always wear your seat belt while driving or riding in a vehicle. Do not drive: If you have been drinking alcohol. Do not ride with someone who has been drinking. When you are tired or distracted. While texting. Wear a helmet and other protective equipment during sports activities. If you have firearms in your house, make sure you follow all gun safety procedures. What's next? Visit your health care provider once a year for an annual wellness visit. Ask your health care provider how often you should have your eyes and teeth checked. Stay up to date on all vaccines. This information is not intended to replace advice given to you by your health care provider. Make sure you discuss any questions you have with your health care provider. Document Revised: 02/05/2021 Document Reviewed: 08/09/2018 Elsevier Patient Education  2022 [employer].

## 2021-09-23 ENCOUNTER — Telehealth: Payer: Self-pay

## 2021-09-23 ENCOUNTER — Other Ambulatory Visit: Payer: Self-pay

## 2021-09-23 DIAGNOSIS — Z1211 Encounter for screening for malignant neoplasm of colon: Secondary | ICD-10-CM

## 2021-09-23 DIAGNOSIS — Z800 Family history of malignant neoplasm of digestive organs: Secondary | ICD-10-CM

## 2021-09-23 LAB — CBC WITH DIFFERENTIAL/PLATELET
Absolute Monocytes: 405 cells/uL (ref 200–950)
Basophils Absolute: 40 cells/uL (ref 0–200)
Basophils Relative: 0.8 %
Eosinophils Absolute: 200 cells/uL (ref 15–500)
Eosinophils Relative: 4 %
HCT: 40.1 % (ref 35.0–45.0)
Hemoglobin: 13.5 g/dL (ref 11.7–15.5)
Lymphs Abs: 1575 cells/uL (ref 850–3900)
MCH: 29.8 pg (ref 27.0–33.0)
MCHC: 33.7 g/dL (ref 32.0–36.0)
MCV: 88.5 fL (ref 80.0–100.0)
MPV: 10.8 fL (ref 7.5–12.5)
Monocytes Relative: 8.1 %
Neutro Abs: 2780 cells/uL (ref 1500–7800)
Neutrophils Relative %: 55.6 %
Platelets: 281 10*3/uL (ref 140–400)
RBC: 4.53 10*6/uL (ref 3.80–5.10)
RDW: 12.8 % (ref 11.0–15.0)
Total Lymphocyte: 31.5 %
WBC: 5 10*3/uL (ref 3.8–10.8)

## 2021-09-23 LAB — COMPLETE METABOLIC PANEL WITH GFR
AG Ratio: 1.6 (calc) (ref 1.0–2.5)
ALT: 13 U/L (ref 6–29)
AST: 19 U/L (ref 10–35)
Albumin: 4.3 g/dL (ref 3.6–5.1)
Alkaline phosphatase (APISO): 73 U/L (ref 37–153)
BUN: 13 mg/dL (ref 7–25)
CO2: 30 mmol/L (ref 20–32)
Calcium: 9.7 mg/dL (ref 8.6–10.4)
Chloride: 105 mmol/L (ref 98–110)
Creat: 0.78 mg/dL (ref 0.50–1.03)
Globulin: 2.7 g/dL (calc) (ref 1.9–3.7)
Glucose, Bld: 96 mg/dL (ref 65–99)
Potassium: 4.2 mmol/L (ref 3.5–5.3)
Sodium: 141 mmol/L (ref 135–146)
Total Bilirubin: 0.4 mg/dL (ref 0.2–1.2)
Total Protein: 7 g/dL (ref 6.1–8.1)
eGFR: 88 mL/min/{1.73_m2} (ref >=60)

## 2021-09-23 LAB — CYTOLOGY - PAP
Chlamydia: NEGATIVE
Comment: NEGATIVE
Comment: NORMAL
Diagnosis: NEGATIVE
Neisseria Gonorrhea: NEGATIVE

## 2021-09-23 LAB — LIPID PANEL
Cholesterol: 232 mg/dL — ABNORMAL HIGH (ref <200)
HDL: 119 mg/dL (ref >=50)
LDL Cholesterol (Calc): 97 mg/dL (calc)
Non-HDL Cholesterol (Calc): 113 mg/dL (calc) (ref <130)
Total CHOL/HDL Ratio: 1.9 (calc) (ref <5.0)
Triglycerides: 73 mg/dL (ref <150)

## 2021-09-23 LAB — HEMOGLOBIN A1C
Hgb A1c MFr Bld: 5.4 % of total Hgb (ref <5.7)
Mean Plasma Glucose: 108 mg/dL
eAG (mmol/L): 6 mmol/L

## 2021-09-23 MED ORDER — NA SULFATE-K SULFATE-MG SULF 17.5-3.13-1.6 GM/177ML PO SOLN: 1.0000 | Freq: Once | ORAL | 0 refills | Status: AC

## 2021-09-23 NOTE — Progress Notes (Signed)
Gastroenterology Pre-Procedure Review  Request Date: 10/06/21 Requesting Physician: Dr. Bonna Gains  PATIENT REVIEW QUESTIONS: The patient responded to the following health history questions as indicated:    1. Are you having any GI issues? no 2. Do you have a personal history of Polyps? yes (01/2010) 3. Do you have a family history of Colon Cancer or Polyps? yes (Father colon cancer) 4. Diabetes Mellitus? no 5. Joint replacements in the past 12 months?no 6. Major health problems in the past 3 months?no 7. Any artificial heart valves, MVP, or defibrillator?no    MEDICATIONS & ALLERGIES:    Patient reports the following regarding taking any anticoagulation/antiplatelet therapy:   Plavix, Coumadin, Eliquis, Xarelto, Lovenox, Pradaxa, Brilinta, or Effient? no Aspirin? no  Patient confirms/reports the following medications:  No current outpatient medications on file.   No current facility-administered medications for this visit.    Patient confirms/reports the following allergies:  Allergies  Allergen Reactions   Iodinated Diagnostic Agents Swelling    Contrast dye- facial swelling   Codeine Other (See Comments)    No orders of the defined types were placed in this encounter.   AUTHORIZATION INFORMATION Primary Insurance: 1D#: Group #:  Secondary Insurance: 1D#: Group #:  SCHEDULE INFORMATION: Date: 10/06/21 Time: Location: [place]

## 2021-09-23 NOTE — Telephone Encounter (Signed)
Procedure scheduled for 10/06/21.

## 2021-09-23 NOTE — Telephone Encounter (Signed)
Patient is returning call to schedule colonoscopy

## 2021-09-27 DIAGNOSIS — L400 Psoriasis vulgaris: Secondary | ICD-10-CM | POA: Diagnosis not present

## 2021-09-27 DIAGNOSIS — X32XXXA Exposure to sunlight, initial encounter: Secondary | ICD-10-CM | POA: Diagnosis not present

## 2021-09-27 DIAGNOSIS — L820 Inflamed seborrheic keratosis: Secondary | ICD-10-CM | POA: Diagnosis not present

## 2021-09-27 DIAGNOSIS — L570 Actinic keratosis: Secondary | ICD-10-CM | POA: Diagnosis not present

## 2021-09-27 DIAGNOSIS — D2271 Melanocytic nevi of right lower limb, including hip: Secondary | ICD-10-CM | POA: Diagnosis not present

## 2021-09-27 DIAGNOSIS — D2261 Melanocytic nevi of right upper limb, including shoulder: Secondary | ICD-10-CM | POA: Diagnosis not present

## 2021-09-27 DIAGNOSIS — D225 Melanocytic nevi of trunk: Secondary | ICD-10-CM | POA: Diagnosis not present

## 2021-09-27 DIAGNOSIS — D2262 Melanocytic nevi of left upper limb, including shoulder: Secondary | ICD-10-CM | POA: Diagnosis not present

## 2021-09-27 DIAGNOSIS — L538 Other specified erythematous conditions: Secondary | ICD-10-CM | POA: Diagnosis not present

## 2021-09-27 DIAGNOSIS — D485 Neoplasm of uncertain behavior of skin: Secondary | ICD-10-CM | POA: Diagnosis not present

## 2021-10-06 ENCOUNTER — Ambulatory Visit
Admission: RE | Admit: 2021-10-06 | Discharge: 2021-10-06 | Disposition: A | Discharge status: Home / Self Care | Payer: 59 | Attending: Gastroenterology | Admitting: Gastroenterology

## 2021-10-06 ENCOUNTER — Ambulatory Visit: Payer: 59 | Admitting: Anesthesiology

## 2021-10-06 ENCOUNTER — Encounter
Admission: RE | Disposition: A | Discharge status: Home / Self Care | Payer: Self-pay | Source: Home / Self Care | Attending: Gastroenterology

## 2021-10-06 DIAGNOSIS — K5730 Diverticulosis of large intestine without perforation or abscess without bleeding: Secondary | ICD-10-CM | POA: Insufficient documentation

## 2021-10-06 DIAGNOSIS — K635 Polyp of colon: Secondary | ICD-10-CM

## 2021-10-06 DIAGNOSIS — D123 Benign neoplasm of transverse colon: Secondary | ICD-10-CM | POA: Insufficient documentation

## 2021-10-06 DIAGNOSIS — K648 Other hemorrhoids: Secondary | ICD-10-CM | POA: Diagnosis not present

## 2021-10-06 DIAGNOSIS — Z800 Family history of malignant neoplasm of digestive organs: Secondary | ICD-10-CM

## 2021-10-06 DIAGNOSIS — Z1211 Encounter for screening for malignant neoplasm of colon: Secondary | ICD-10-CM | POA: Insufficient documentation

## 2021-10-06 DIAGNOSIS — D126 Benign neoplasm of colon, unspecified: Secondary | ICD-10-CM | POA: Diagnosis not present

## 2021-10-06 HISTORY — PX: COLONOSCOPY WITH PROPOFOL: SHX5780

## 2021-10-06 SURGERY — COLONOSCOPY WITH PROPOFOL
Anesthesia: General

## 2021-10-06 MED ORDER — SODIUM CHLORIDE 0.9 % IV SOLN
INTRAVENOUS | Status: DC
  Administered 2021-10-06: 20 mL/h via INTRAVENOUS

## 2021-10-06 MED ORDER — PROPOFOL 10 MG/ML IV BOLUS
INTRAVENOUS | Status: DC | PRN
  Administered 2021-10-06: 50 mg via INTRAVENOUS

## 2021-10-06 MED ORDER — PROPOFOL 500 MG/50ML IV EMUL
INTRAVENOUS | Status: AC
  Filled 2021-10-06: qty 50

## 2021-10-06 MED ORDER — MIDAZOLAM HCL 2 MG/2ML IJ SOLN
INTRAMUSCULAR | Status: AC
  Filled 2021-10-06: qty 2

## 2021-10-06 MED ORDER — LIDOCAINE HCL (CARDIAC) PF 100 MG/5ML IV SOSY
PREFILLED_SYRINGE | INTRAVENOUS | Status: DC | PRN
  Administered 2021-10-06: 50 mg via INTRAVENOUS

## 2021-10-06 MED ORDER — MIDAZOLAM HCL 2 MG/2ML IJ SOLN
INTRAMUSCULAR | Status: DC | PRN
  Administered 2021-10-06: 2 mg via INTRAVENOUS

## 2021-10-06 MED ORDER — PROPOFOL 500 MG/50ML IV EMUL
INTRAVENOUS | Status: DC | PRN
  Administered 2021-10-06: 50 ug/kg/min via INTRAVENOUS

## 2021-10-06 MED ORDER — FENTANYL CITRATE (PF) 100 MCG/2ML IJ SOLN
INTRAMUSCULAR | Status: AC
  Filled 2021-10-06: qty 2

## 2021-10-06 MED ORDER — FENTANYL CITRATE (PF) 100 MCG/2ML IJ SOLN
INTRAMUSCULAR | Status: DC | PRN
  Administered 2021-10-06 (×2): 50 ug via INTRAVENOUS

## 2021-10-06 NOTE — Transfer of Care (Signed)
Immediate Anesthesia Transfer of Care Note  Patient: Jacqueline Rush  Procedure(s) Performed: COLONOSCOPY WITH PROPOFOL  Patient Location: PACU  Anesthesia Type:General  Level of Consciousness: sedated  Airway & Oxygen Therapy: Patient Spontanous Breathing and Patient connected to nasal cannula oxygen  Post-op Assessment: Report given to RN and Post -op Vital signs reviewed and stable  Post vital signs: Reviewed and stable  Last Vitals:  Vitals Value Taken Time  BP 91/52 10/06/21 1016  Temp    Pulse 76 10/06/21 1016  Resp 15 10/06/21 1016  SpO2 95 % 10/06/21 1016  Vitals shown include unvalidated device data.  Last Pain:  Vitals:   10/06/21 0817  TempSrc: Temporal  PainSc: 0-No pain         Complications: No notable events documented.

## 2021-10-06 NOTE — Op Note (Signed)
[facility] Gastroenterology Patient Name: Chiffon Kittleson Procedure Date: 10/06/2021 9:41 AM MRN: [id number] Account #: [id number] Date of Birth: [birth year] Admit Type: Outpatient Age: 60 Room: [facility] ENDO ROOM 3 Gender: Female Note Status: Finalized Instrument Name: Jasper Riling [id number] Procedure:             Colonoscopy Indications:           Screening in patient at increased risk: Family history                         of 1st-degree relative with colorectal cancer Providers:             Jansen Goodpasture B. Bonna Gains MD, MD Referring MD:          Ahmed Prima NP Medicines:             Monitored Anesthesia Care Complications:         No immediate complications. Procedure:             Pre-Anesthesia Assessment:                        - ASA Grade Assessment: II - A patient with mild                         systemic disease.                        - Prior to the procedure, a History and Physical was                         performed, and patient medications, allergies and                         sensitivities were reviewed. The patient's tolerance                         of previous anesthesia was reviewed.                        - The risks and benefits of the procedure and the                         sedation options and risks were discussed with the                         patient. All questions were answered and informed                         consent was obtained.                        - Patient identification and proposed procedure were                         verified prior to the procedure by the physician, the                         nurse, the anesthesiologist, the anesthetist and the  technician. The procedure was verified in the                         procedure room.                        After obtaining informed consent, the colonoscope was                         passed under direct vision. Throughout the procedure,                          the patient's blood pressure, pulse, and oxygen                         saturations were monitored continuously. The                         Colonoscope was introduced through the anus and                         advanced to the the cecum, identified by appendiceal                         orifice and ileocecal valve. The colonoscopy was                         performed with ease. The patient tolerated the                         procedure well. The quality of the bowel preparation                         was good. Findings:      The perianal and digital rectal examinations were normal.      A 8 mm polyp was found in the transverse colon. The polyp was sessile.       The polyp was removed with a cold snare. Resection and retrieval were       complete.      A few diverticula were found in the sigmoid colon.      The exam was otherwise without abnormality.      The rectum, sigmoid colon, descending colon, transverse colon, ascending       colon and cecum appeared normal.      Non-bleeding internal hemorrhoids were found during retroflexion. Impression:            - One 8 mm polyp in the transverse colon, removed with                         a cold snare. Resected and retrieved.                        - Diverticulosis in the sigmoid colon.                        - The examination was otherwise normal.                        - The rectum, sigmoid colon, descending colon,  transverse colon, ascending colon and cecum are normal.                        - Non-bleeding internal hemorrhoids. Recommendation:        - Discharge patient to home (with escort).                        - Advance diet as tolerated.                        - Continue present medications.                        - Await pathology results.                        - Repeat colonoscopy date to be determined after                         pending pathology results are reviewed.                         - The findings and recommendations were discussed with                         the patient.                        - The findings and recommendations were discussed with                         the patient's family.                        - Return to primary care physician as previously                         scheduled.                        - High fiber diet. Procedure Code(s):     --- Professional ---                        [phone], Colonoscopy, flexible; with removal of                         tumor(s), polyp(s), or other lesion(s) by snare                         technique Diagnosis Code(s):     --- Professional ---                        Z80.0, Family history of malignant neoplasm of                         digestive organs                        K63.5, Polyp of colon CPT copyright 2019 American Medical Association. All rights reserved. The codes documented in this report are preliminary and upon coder review may  be revised to meet current compliance requirements.  Vonda Antigua, MD Margretta Sidle B. Bonna Gains MD, MD 10/06/2021 10:16:44 AM This report has been signed electronically. Number of Addenda: 0 Note Initiated On: 10/06/2021 9:41 AM Scope Withdrawal Time: 0 hours 11 minutes 42 seconds  Total Procedure Duration: 0 hours 18 minutes 21 seconds  Estimated Blood Loss:  Estimated blood loss: none.      [facility]

## 2021-10-06 NOTE — H&P (Signed)
Vonda Antigua, MD [address] [place], [place], [place], [address] [address], [place], [place], [place], [address] Phone: [phone]  Fax: [phone]  Primary Care Physician:  Bo Merino, FNP   Pre-Procedure History & Physical: HPI:  Jacqueline Rush is a 60 y.o. female is here for a colonoscopy.   Past Medical History:  Diagnosis Date   Allergy    Arthritis    Family history of adverse reaction to anesthesia    dad had hiccups for 10 days after surgery   Pre-diabetes    Varicose veins     Past Surgical History:  Procedure Laterality Date   BREAST CYST ASPIRATION Left    CESAREAN SECTION     EXTERNAL EAR SURGERY     pt states had right ear surgery   IRRIGATION AND DEBRIDEMENT KNEE Left 08/09/2018   Procedure: IRRIGATION AND DEBRIDEMENT KNEE WITH BURSA EXCISION;  Surgeon: Corky Mull, MD;  Location: [facility];  Service: Orthopedics;  Laterality: Left;   KNEE ARTHROSCOPY WITH MEDIAL MENISECTOMY Right 06/01/2016   Procedure: Arthroscopic partial medial meniscectomy, arthroscopic abrasion chondroplasty of femoral trochlea, and debridement of symptomatic plica, right knee.;  Surgeon: Corky Mull, MD;  Location: [facility];  Service: Orthopedics;  Laterality: Right;   SHOULDER ARTHROSCOPY WITH DEBRIDEMENT AND BICEP TENDON REPAIR Left 06/30/2016   Procedure: SHOULDER ARTHROSCOPY WITH DEBRIDEMENT AND BICEP TENDON REPAIR;  Surgeon: Corky Mull, MD;  Location: [facility];  Service: Orthopedics;  Laterality: Left;   SHOULDER ARTHROSCOPY WITH LABRAL REPAIR Left 06/30/2016   Procedure: SHOULDER ARTHROSCOPY WITH LABRAL REPAIR;  Surgeon: Corky Mull, MD;  Location: [facility];  Service: Orthopedics;  Laterality: Left;    Prior to Admission medications   Not on File    Allergies as of 09/23/2021 - Review Complete 09/22/2021  Allergen Reaction Noted   Iodinated diagnostic agents Swelling 12/09/2015   Codeine Other (See Comments) 12/09/2015    Family  History  Problem Relation Age of Onset   Cancer Father        colon, prostate, skin   Diabetes Father    Colon cancer Father    Prostate cancer Father    Ulcerative colitis Daughter    Hypertension Mother    Heart disease Mother        heart attack in 2017   Cancer Maternal Uncle        prostate and colon   Colon cancer Maternal Uncle    Prostate cancer Maternal Uncle    Stroke Maternal Grandmother    Heart disease Maternal Grandfather        died from a heart attack   Cancer Paternal Grandfather        colon   Colon cancer Paternal Grandfather     Social History   Socioeconomic History   Marital status: Married    Spouse name: John   Number of children: Not on file   Years of education: Not on file   Highest education level: Not on file  Occupational History   Not on file  Tobacco Use   Smoking status: Never   Smokeless tobacco: Never  Vaping Use   Vaping Use: Never used  Substance and Sexual Activity   Alcohol use: Yes    Alcohol/week: 0.0 standard drinks    Comment: occasional   Drug use: No   Sexual activity: Yes    Partners: Male  Other Topics Concern   Not on file  Social History Narrative  Not on file   Social Determinants of Health   Financial Resource Strain: Low Risk    Difficulty of Paying Living Expenses: Not hard at all  Food Insecurity: No Food Insecurity   Worried About Charity fundraiser in the Last Year: Never true   [place] in the Last Year: Never true  Transportation Needs: No Transportation Needs   Lack of Transportation (Medical): No   Lack of Transportation (Non-Medical): No  Physical Activity: Sufficiently Active   Days of Exercise per Week: 5 days   Minutes of Exercise per Session: 60 min  Stress: No Stress Concern Present   Feeling of Stress : Only a little  Social Connections: Unknown   Frequency of Communication with Friends and Family: More than three times a week   Frequency of Social Gatherings with Friends and  Family: Three times a week   Attends Religious Services: Patient refused   Active Member of Clubs or Organizations: Yes   Attends Archivist Meetings: 1 to 4 times per year   Marital Status: Married  Human resources officer Violence: Not At Risk   Fear of Current or Ex-Partner: No   Emotionally Abused: No   Physically Abused: No   Sexually Abused: No    Review of Systems: See HPI, otherwise negative ROS  Physical Exam: Constitutional: General:   Alert,  Well-developed, well-nourished, pleasant and cooperative in NAD BP (!) 119/99   Pulse 96   Temp (!) 96.7 F (35.9 C) (Temporal)   Resp 20   Ht 5' (1.524 m)   Wt 58.1 kg   SpO2 99%   BMI 25.00 kg/m   Head: Normocephalic, atraumatic.   Eyes:  Sclera clear, no icterus.   Conjunctiva pink.   Mouth:  No deformity or lesions, oropharynx pink & moist.  Neck:  Supple, trachea midline  Respiratory: Normal respiratory effort  Gastrointestinal:  Soft, non-tender and non-distended without masses, hepatosplenomegaly or hernias noted.  No guarding or rebound tenderness.     Cardiac: No clubbing or edema.  No cyanosis. Normal posterior tibial pedal pulses noted.  Lymphatic:  No significant cervical adenopathy.  Psych:  Alert and cooperative. Normal mood and affect.  Musculoskeletal:   Symmetrical without gross deformities. 5/5 Lower extremity strength bilaterally.  Skin: Warm. Intact without significant lesions or rashes. No jaundice.  Neurologic:  Face symmetrical, tongue midline, Normal sensation to touch;  grossly normal neurologically.  Psych:  Alert and oriented x3, Alert and cooperative. Normal mood and affect.  Impression/Plan: Jacqueline Rush is here for a colonoscopy to be performed for family history of colon cancer in father. Pt reports she has had one previous colonoscopy about 11 years ago and was supposed to have another one right before the pandemic but could not get it done due to the pandemic. Prior  reports not available  Risks, benefits, limitations, and alternatives regarding  colonoscopy have been reviewed with the patient.  Questions have been answered.  All parties agreeable.   Virgel Manifold, MD  10/06/2021, 9:47 AM

## 2021-10-06 NOTE — Anesthesia Postprocedure Evaluation (Signed)
Anesthesia Post Note  Patient: Jacqueline Rush  Procedure(s) Performed: COLONOSCOPY WITH PROPOFOL  Patient location during evaluation: Endoscopy Anesthesia Type: General Level of consciousness: awake and alert Pain management: pain level controlled Vital Signs Assessment: post-procedure vital signs reviewed and stable Respiratory status: spontaneous breathing, nonlabored ventilation and respiratory function stable Cardiovascular status: blood pressure returned to baseline and stable Postop Assessment: no apparent nausea or vomiting Anesthetic complications: no   No notable events documented.   Last Vitals:  Vitals:   10/06/21 1014 10/06/21 1024  BP: (!) 91/52 119/77  Pulse: 72   Resp: 10   Temp: (!) 36.4 C   SpO2: 97%     Last Pain:  Vitals:   10/06/21 1034  TempSrc:   PainSc: 0-No pain                 [place] Ouch

## 2021-10-06 NOTE — Anesthesia Preprocedure Evaluation (Addendum)
Anesthesia Evaluation  Patient identified by MRN, date of birth, ID band Patient awake    Reviewed: Allergy & Precautions, H&P , NPO status , Patient's Chart, lab work & pertinent test results, reviewed documented beta blocker date and time   Airway Mallampati: I   Neck ROM: full    Dental no notable dental hx.    Pulmonary neg pulmonary ROS,    Pulmonary exam normal        Cardiovascular negative cardio ROS Normal cardiovascular exam Rhythm:regular Rate:Normal     Neuro/Psych negative neurological ROS  negative psych ROS   GI/Hepatic negative GI ROS, Neg liver ROS,   Endo/Other  negative endocrine ROS  Renal/GU negative Renal ROS  negative genitourinary   Musculoskeletal negative musculoskeletal ROS (+)   Abdominal Normal abdominal exam  (+)   Peds  Hematology negative hematology ROS (+)   Anesthesia Other Findings Past Medical History: No date: Allergy No date: Arthritis No date: Family history of adverse reaction to anesthesia     Comment:  dad had hiccups for 10 days after surgery No date: Pre-diabetes No date: Varicose veins Past Surgical History: No date: BREAST CYST ASPIRATION; Left No date: CESAREAN SECTION No date: EXTERNAL EAR SURGERY     Comment:  pt states had right ear surgery 06/01/2016: KNEE ARTHROSCOPY WITH MEDIAL MENISECTOMY; Right     Comment:  Procedure: Arthroscopic partial medial meniscectomy,               arthroscopic abrasion chondroplasty of femoral trochlea,               and debridement of symptomatic plica, right knee.;                Surgeon: Corky Mull, MD;  Location: [facility];  Service: Orthopedics;  Laterality: Right; 06/30/2016: SHOULDER ARTHROSCOPY WITH DEBRIDEMENT AND BICEP TENDON  REPAIR; Left     Comment:  Procedure: SHOULDER ARTHROSCOPY WITH DEBRIDEMENT AND               BICEP TENDON REPAIR;  Surgeon: Corky Mull, MD;                 Location: [facility];  Service: Orthopedics;  Laterality:               Left; 06/30/2016: SHOULDER ARTHROSCOPY WITH LABRAL REPAIR; Left     Comment:  Procedure: SHOULDER ARTHROSCOPY WITH LABRAL REPAIR;                Surgeon: Corky Mull, MD;  Location: [facility];  Service:              Orthopedics;  Laterality: Left; BMI    Body Mass Index:  24.41 kg/m     Reproductive/Obstetrics negative OB ROS                            Anesthesia Physical  Anesthesia Plan  ASA: 1  Anesthesia Plan: General   Post-op Pain Management:    Induction:   PONV Risk Score and Plan: 3 and Propofol infusion, TIVA and Treatment may vary due to age or medical condition  Airway Management Planned: Natural Airway and Nasal Cannula  Additional Equipment:   Intra-op Plan:   Post-operative Plan:   Informed Consent: I have reviewed the patients History and Physical, chart, labs and discussed the procedure including  the risks, benefits and alternatives for the proposed anesthesia with the patient or authorized representative who has indicated his/her understanding and acceptance.     Dental Advisory Given  Plan Discussed with: CRNA and Anesthesiologist  Anesthesia Plan Comments:        Anesthesia Quick Evaluation

## 2021-10-07 ENCOUNTER — Encounter: Payer: Self-pay | Admitting: Gastroenterology

## 2021-10-07 ENCOUNTER — Ambulatory Visit
Admission: RE | Admit: 2021-10-07 | Discharge: 2021-10-07 | Disposition: A | Discharge status: Home / Self Care | Payer: 59 | Source: Ambulatory Visit | Attending: Nurse Practitioner | Admitting: Nurse Practitioner

## 2021-10-07 ENCOUNTER — Other Ambulatory Visit: Payer: Self-pay

## 2021-10-07 DIAGNOSIS — Z1231 Encounter for screening mammogram for malignant neoplasm of breast: Secondary | ICD-10-CM | POA: Insufficient documentation

## 2021-10-07 LAB — SURGICAL PATHOLOGY

## 2021-10-30 IMAGING — MG MM DIGITAL SCREENING BILAT W/ TOMO AND CAD
8 series · 8 of 24 positions shown · non-contrast
Comparison: Previous exam(s).

CLINICAL DATA: Screening.

EXAM:
DIGITAL SCREENING BILATERAL MAMMOGRAM WITH TOMOSYNTHESIS AND CAD
TECHNIQUE: Bilateral screening digital craniocaudal and mediolateral oblique
mammograms were obtained. Bilateral screening digital breast
tomosynthesis was performed. The images were evaluated with
computer-aided detection.

[L CC synth-2D]
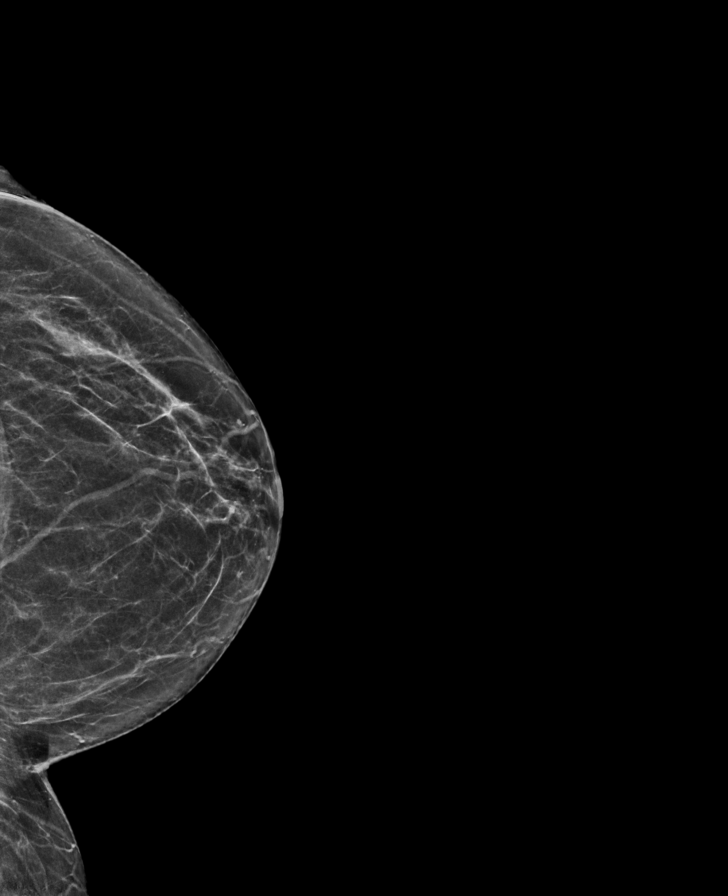

[R MLO synth-2D]
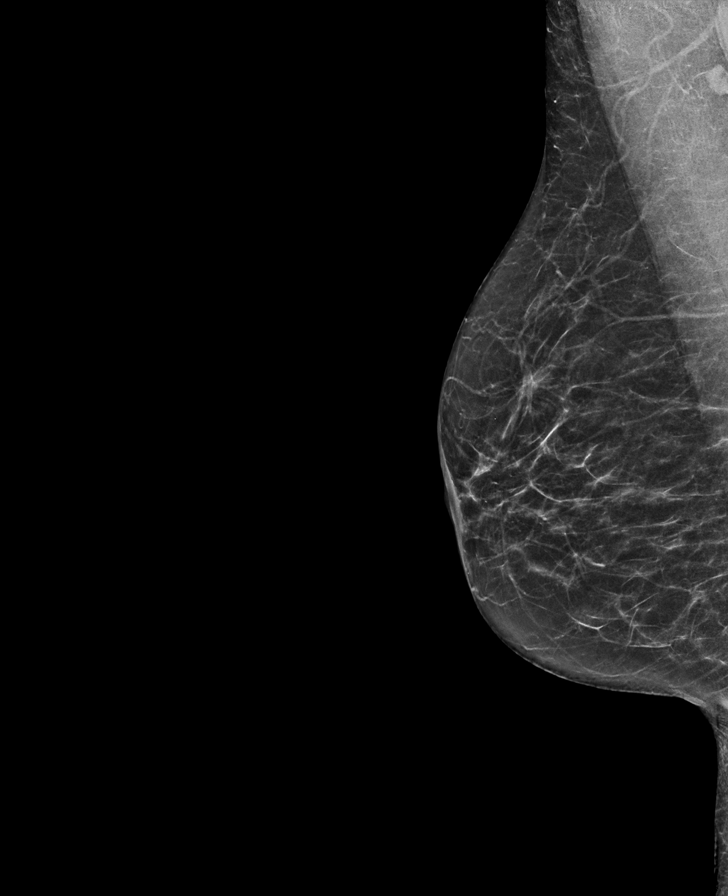

[R CC synth-2D]
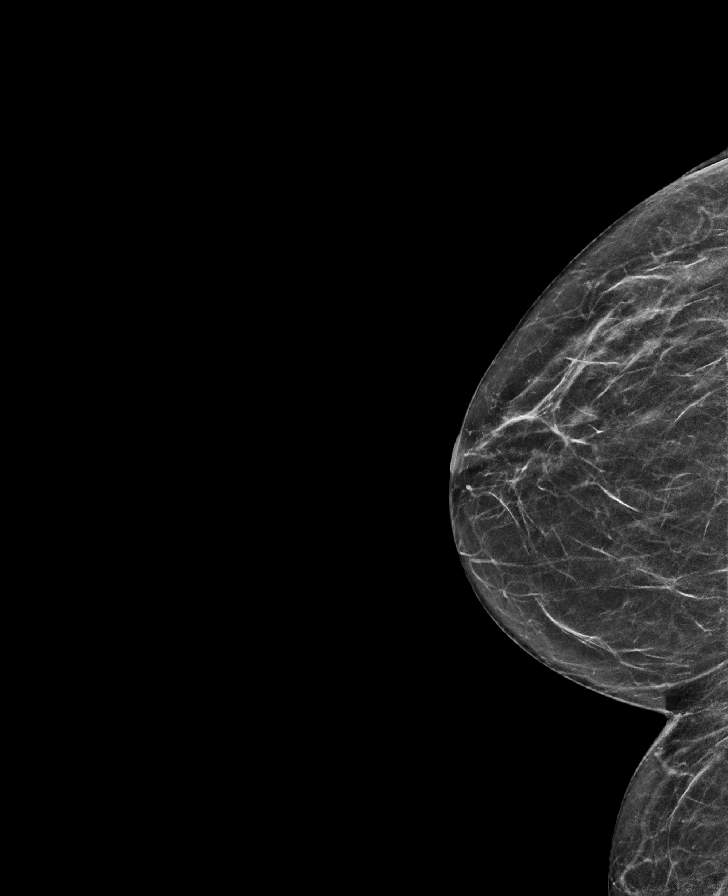

[L MLO synth-2D]
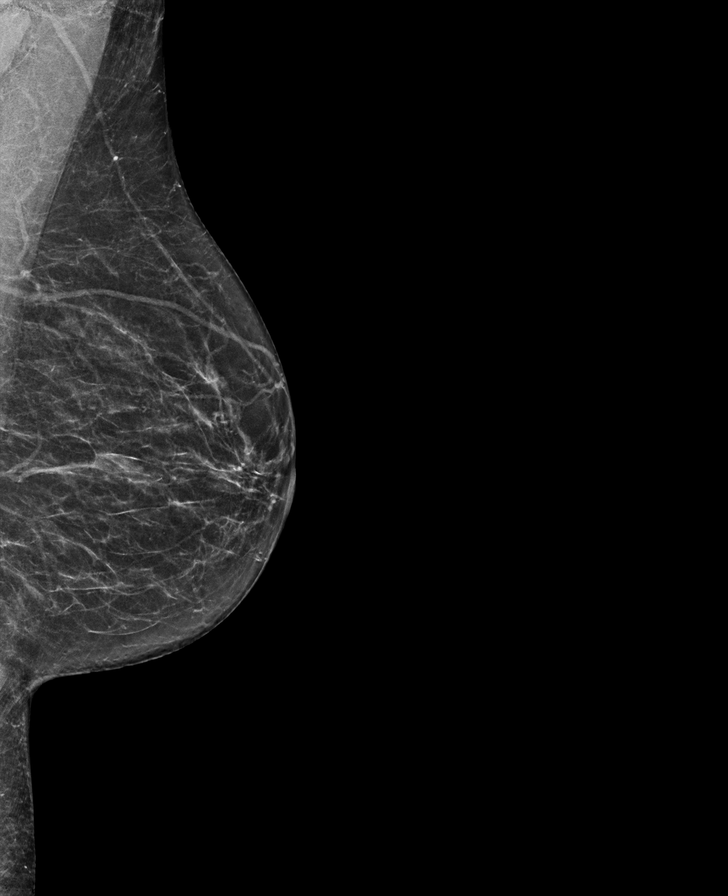

[R CC tomo · tomo slice 28/55.0]
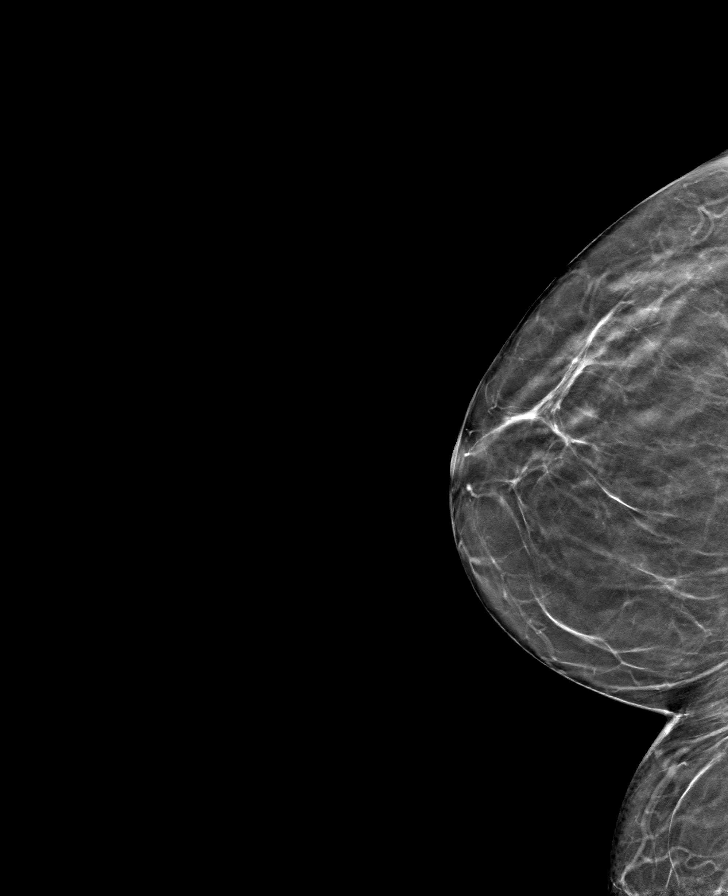

[R MLO tomo · tomo slice 30/59.0]
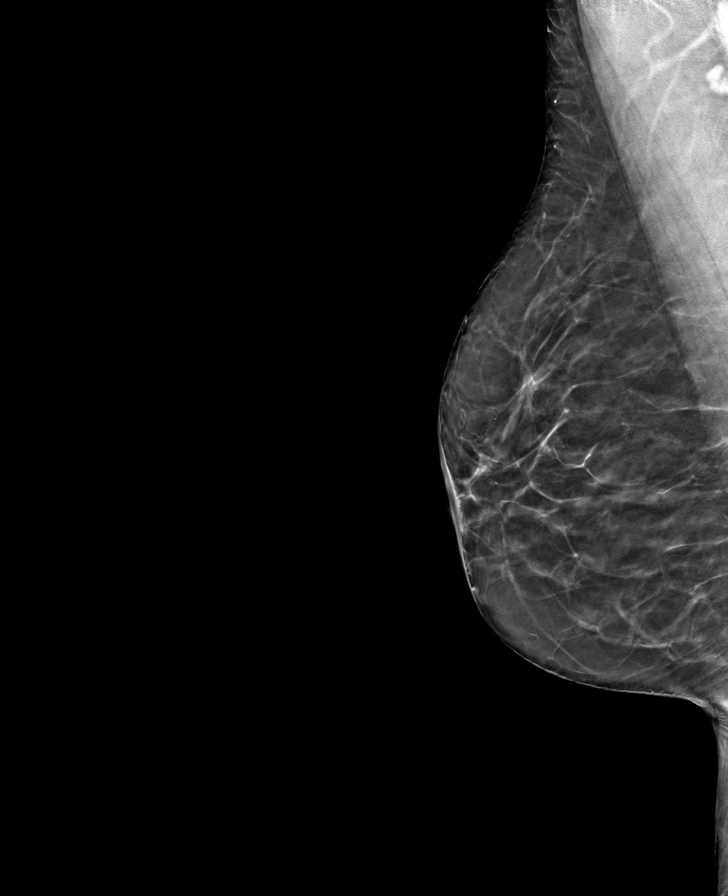

[L CC tomo · tomo slice 29/58.0]
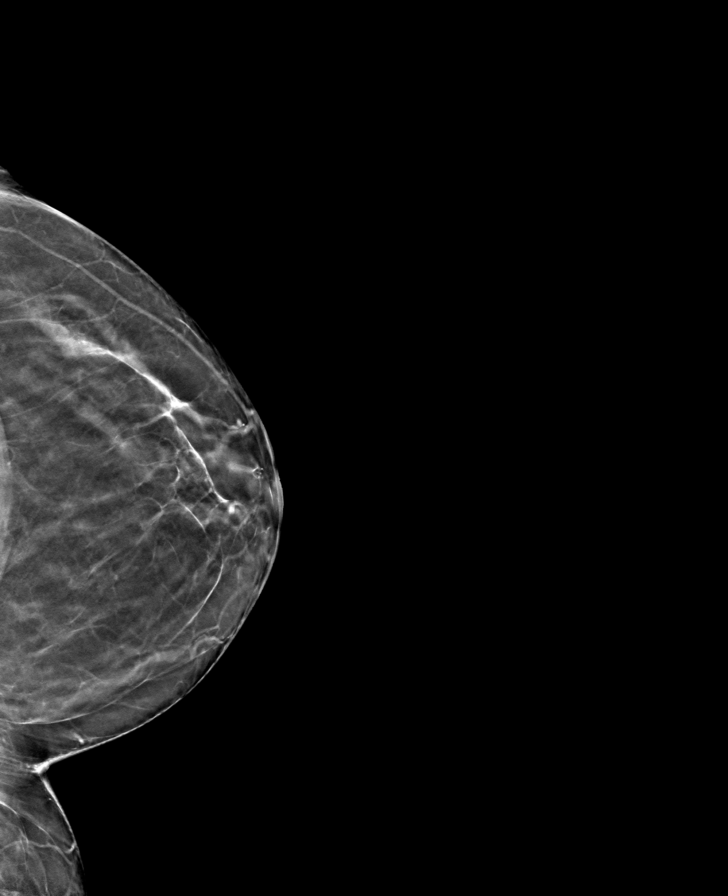

[L MLO tomo · tomo slice 31/60.0]
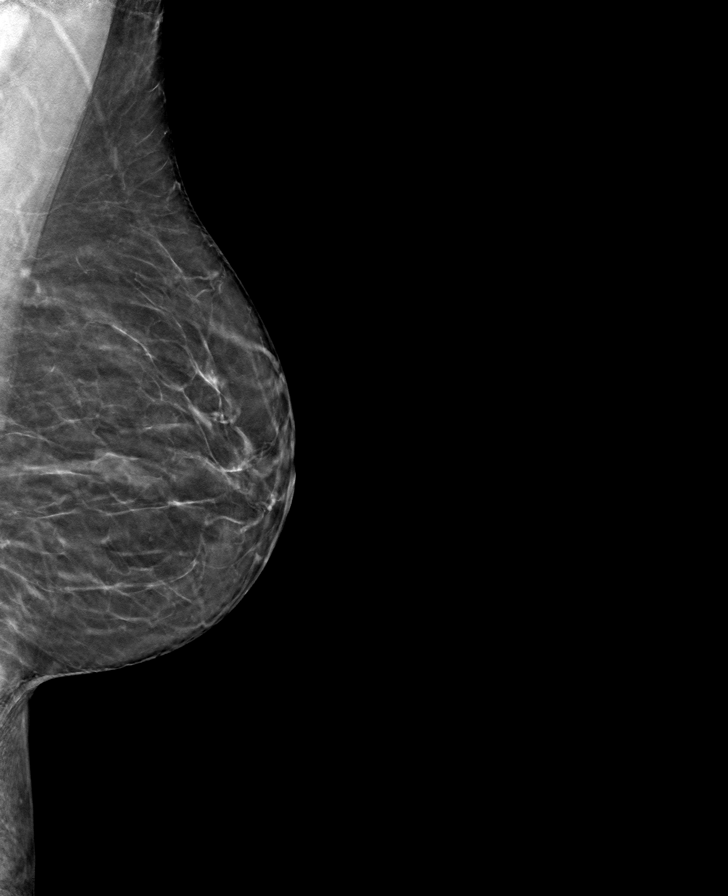

[8 of 24 positions shown; findings below may reference images not displayed]

ACR Breast Density Category b: There are scattered areas of
fibroglandular density.
FINDINGS: There are no findings suspicious for malignancy.
IMPRESSION: No mammographic evidence of malignancy. A result letter of this
screening mammogram will be mailed directly to the patient.

RECOMMENDATION:
Screening mammogram in one year. (Code:51-O-LD2)

BI-RADS CATEGORY  1: Negative.

## 2022-01-18 DIAGNOSIS — J069 Acute upper respiratory infection, unspecified: Secondary | ICD-10-CM | POA: Diagnosis not present

## 2022-01-18 DIAGNOSIS — J019 Acute sinusitis, unspecified: Secondary | ICD-10-CM | POA: Diagnosis not present

## 2022-03-17 ENCOUNTER — Ambulatory Visit: Payer: Self-pay | Admitting: Dermatology

## 2022-07-19 ENCOUNTER — Other Ambulatory Visit: Payer: Self-pay | Admitting: Surgery

## 2022-07-19 DIAGNOSIS — M19112 Post-traumatic osteoarthritis, left shoulder: Secondary | ICD-10-CM

## 2022-07-19 DIAGNOSIS — S43432S Superior glenoid labrum lesion of left shoulder, sequela: Secondary | ICD-10-CM

## 2022-07-19 DIAGNOSIS — M7582 Other shoulder lesions, left shoulder: Secondary | ICD-10-CM

## 2022-07-27 ENCOUNTER — Other Ambulatory Visit: Payer: 59

## 2022-07-29 ENCOUNTER — Ambulatory Visit
Admission: RE | Admit: 2022-07-29 | Discharge: 2022-07-29 | Disposition: A | Discharge status: Home / Self Care | Payer: 59 | Source: Ambulatory Visit | Attending: Surgery | Admitting: Surgery

## 2022-07-29 DIAGNOSIS — S43432S Superior glenoid labrum lesion of left shoulder, sequela: Secondary | ICD-10-CM

## 2022-07-29 DIAGNOSIS — M7582 Other shoulder lesions, left shoulder: Secondary | ICD-10-CM

## 2022-07-29 DIAGNOSIS — M19112 Post-traumatic osteoarthritis, left shoulder: Secondary | ICD-10-CM

## 2022-09-22 NOTE — Progress Notes (Signed)
Name: Jacqueline Rush   MRN: [id number]    DOB: 04-06-1961   Date:09/23/2022       Progress Note  Subjective  Chief Complaint  Chief Complaint  Patient presents with   Annual Exam    HPI  Patient presents for annual CPE.  Diet: well balanced diet 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[place]: she is going to try to go to bed earlier, she does not sleep much maybe 5-6 hours Last dental exam:six months ago Last eye exam: she is due for one  [place] Visit from 09/23/2022 in [facility]  AUDIT-C Score 0      Depression: Phq 9 is  negative    09/23/2022    9:49 AM 09/22/2021   11:11 AM 08/24/2018    3:40 PM 08/01/2018    1:28 PM 02/27/2018    3:23 PM  Depression screen PHQ 2/9  Decreased Interest 0 0 0 0 0  Down, Depressed, Hopeless 0 0 0 0 0  PHQ - 2 Score 0 0 0 0 0  Altered sleeping  0  0   Tired, decreased energy  0  0   Change in appetite  0  0   Feeling bad or failure about yourself   0  0   Trouble concentrating  0  0   Moving slowly or fidgety/restless  0  0   Suicidal thoughts  0  0   PHQ-9 Score  0  0   Difficult doing work/chores  Not difficult at all  Not difficult at all    Hypertension: BP Readings from Last 3 Encounters:  09/23/22 120/76  10/06/21 119/77  09/22/21 124/72   Obesity: Wt Readings from Last 3 Encounters:  09/23/22 136 lb 4.8 oz (61.8 kg)  10/06/21 128 lb (58.1 kg)  09/22/21 131 lb 9.6 oz (59.7 kg)   BMI Readings from Last 3 Encounters:  09/23/22 26.62 kg/m  10/06/21 25.00 kg/m  09/22/21 25.70 kg/m     Vaccines:  HPV: up to at age 28 , ask insurance if age between 66-45  Shingrix: 71-64 yo and ask insurance if covered when patient above 20 yo Pneumonia:  educated and discussed with patient. Flu:  educated and discussed with patient.  Hep C Screening: 01/24/2017 STD testing and prevention (HIV/chl/gon/syphilis): 01/24/2017 Intimate partner violence:none Sexual History :yes Menstrual  History/LMP/Abnormal Bleeding: postmenopausal  Incontinence Symptoms: none  Breast cancer:  - Last Mammogram: 10/07/2021, ordered - BRCA gene screening: none  Osteoporosis: Discussed high calcium and vitamin D supplementation, weight bearing exercises  Cervical cancer screening: 09/22/2021  Skin cancer: Discussed monitoring for atypical lesions  Colorectal cancer: 10/06/2021   Lung cancer:   Low Dose CT Chest recommended if Age 62-80 years, 20 pack-year currently smoking OR have quit w/in 15years. Patient does not qualify.   ECG: 09/23/2014  Advanced Care Planning: A voluntary discussion about advance care planning including the explanation and discussion of advance directives.  Discussed health care proxy and Living will, and the patient was able to identify a health care proxy as husband.  Patient does not have a living will at present time. If patient does have living will, I have requested they bring this to the clinic to be scanned in to their chart.  Lipids: Lab Results  Component Value Date   CHOL 232 (H) 09/22/2021   CHOL 189 01/24/2017   CHOL 174 12/02/2015   Lab Results  Component Value Date   HDL 119 09/22/2021  HDL 107 01/24/2017   HDL 89 12/02/2015   Lab Results  Component Value Date   LDLCALC 97 09/22/2021   LDLCALC 68 01/24/2017   LDLCALC 73 12/02/2015   Lab Results  Component Value Date   TRIG 73 09/22/2021   TRIG 68 01/24/2017   TRIG 61 12/02/2015   Lab Results  Component Value Date   CHOLHDL 1.9 09/22/2021   CHOLHDL 1.8 01/24/2017   CHOLHDL 2.0 12/02/2015   No results found for: "LDLDIRECT"  Glucose: Glucose  Date Value Ref Range Status  09/23/2014 85 65 - 99 mg/dL Final   Glucose, Bld  Date Value Ref Range Status  09/22/2021 96 65 - 99 mg/dL Final    Comment:    .            Fasting reference interval .   08/07/2018 96 70 - 99 mg/dL Final  08/04/2018 140 (H) 70 - 99 mg/dL Final   Glucose-Capillary  Date Value Ref Range Status   08/09/2018 103 (H) 70 - 99 mg/dL Final  08/09/2018 102 (H) 70 - 99 mg/dL Final    Patient Active Problem List   Diagnosis Date Noted   Family history of colon cancer    Polyp of transverse colon    Septic prepatellar bursitis of left knee 08/09/2018   Sepsis due to cellulitis ([place]) 08/02/2018   Seborrheic keratosis 02/27/2018   Breast cancer screening 01/24/2017   Post-traumatic osteoarthritis of left shoulder 07/01/2016   Primary osteoarthritis of right knee 05/27/2016   Complex tear of medial meniscus of right knee as current injury 05/16/2016   Rotator cuff tendinitis, left 05/16/2016   Tear of left glenoid labrum 05/16/2016   Annual physical exam 12/09/2015   Encounter for screening mammogram for malignant neoplasm of breast 12/09/2015   Skin lesion 12/09/2015   Plantar warts 12/09/2015   Need for immunization against influenza 12/09/2015   Arthritis, multiple joint involvement 12/01/2015   Varicose vein 12/01/2015   Allergic rhinitis 12/01/2015   Prediabetes 12/01/2015    Past Surgical History:  Procedure Laterality Date   BREAST CYST ASPIRATION Left    CESAREAN SECTION     COLONOSCOPY WITH PROPOFOL N/A 10/06/2021   Procedure: COLONOSCOPY WITH PROPOFOL;  Surgeon: Virgel Manifold, MD;  Location: [facility];  Service: Endoscopy;  Laterality: N/A;   EXTERNAL EAR SURGERY     pt states had right ear surgery   IRRIGATION AND DEBRIDEMENT KNEE Left 08/09/2018   Procedure: IRRIGATION AND DEBRIDEMENT KNEE WITH BURSA EXCISION;  Surgeon: Corky Mull, MD;  Location: [facility] ORS;  Service: Orthopedics;  Laterality: Left;   KNEE ARTHROSCOPY WITH MEDIAL MENISECTOMY Right 06/01/2016   Procedure: Arthroscopic partial medial meniscectomy, arthroscopic abrasion chondroplasty of femoral trochlea, and debridement of symptomatic plica, right knee.;  Surgeon: Corky Mull, MD;  Location: [place];  Service: Orthopedics;  Laterality: Right;   SHOULDER ARTHROSCOPY WITH  DEBRIDEMENT AND BICEP TENDON REPAIR Left 06/30/2016   Procedure: SHOULDER ARTHROSCOPY WITH DEBRIDEMENT AND BICEP TENDON REPAIR;  Surgeon: Corky Mull, MD;  Location: [facility] ORS;  Service: Orthopedics;  Laterality: Left;   SHOULDER ARTHROSCOPY WITH LABRAL REPAIR Left 06/30/2016   Procedure: SHOULDER ARTHROSCOPY WITH LABRAL REPAIR;  Surgeon: Corky Mull, MD;  Location: [facility];  Service: Orthopedics;  Laterality: Left;    Family History  Problem Relation Age of Onset   Cancer Father        colon, prostate, skin   Diabetes Father    Colon cancer Father  Prostate cancer Father    Ulcerative colitis Daughter    Hypertension Mother    Heart disease Mother        heart attack in 2017   Cancer Maternal Uncle        prostate and colon   Colon cancer Maternal Uncle    Prostate cancer Maternal Uncle    Stroke Maternal Grandmother    Heart disease Maternal Grandfather        died from a heart attack   Cancer Paternal Grandfather        colon   Colon cancer Paternal Grandfather     Social History   Socioeconomic History   Marital status: Married    Spouse name: John   Number of children: Not on file   Years of education: Not on file   Highest education level: Not on file  Occupational History   Not on file  Tobacco Use   Smoking status: Never   Smokeless tobacco: Never  Vaping Use   Vaping Use: Never used  Substance and Sexual Activity   Alcohol use: Yes    Alcohol/week: 0.0 standard drinks of alcohol    Comment: occasional   Drug use: No   Sexual activity: Yes    Partners: Male  Other Topics Concern   Not on file  Social History Narrative   Not on file   Social Determinants of Health   Financial Resource Strain: Low Risk  (09/23/2022)   Overall Financial Resource Strain (CARDIA)    Difficulty of Paying Living Expenses: Not hard at all  Food Insecurity: No Food Insecurity (09/23/2022)   Hunger Vital Sign    Worried About Running Out of Food in the Last Year: Never  true    Ran Out of Food in the Last Year: Never true  Transportation Needs: No Transportation Needs (09/23/2022)   PRAPARE - Hydrologist (Medical): No    Lack of Transportation (Non-Medical): No  Physical Activity: Sufficiently Active (09/23/2022)   Exercise Vital Sign    Days of Exercise per Week: 4 days    Minutes of Exercise per Session: 40 min  Stress: No Stress Concern Present (09/23/2022)   [place]    Feeling of Stress : Only a little  Social Connections: Socially Integrated (09/23/2022)   Social Connection and Isolation Panel [NHANES]    Frequency of Communication with Friends and Family: More than three times a week    Frequency of Social Gatherings with Friends and Family: Twice a week    Attends Religious Services: 1 to 4 times per year    Active Member of [employer] or Organizations: Yes    Attends Archivist Meetings: 1 to 4 times per year    Marital Status: Married  Human resources officer Violence: Not At Risk (09/23/2022)   Humiliation, Afraid, Rape, and Kick questionnaire    Fear of Current or Ex-Partner: No    Emotionally Abused: No    Physically Abused: No    Sexually Abused: No    No current outpatient medications on file.  Allergies  Allergen Reactions   Iodinated Contrast Media Swelling    Contrast dye- facial swelling   Codeine Other (See Comments)     ROS  Constitutional: Negative for fever or weight change.  Respiratory: Negative for cough and shortness of breath.   Cardiovascular: Negative for chest pain or palpitations.  Gastrointestinal: Negative for abdominal pain, no bowel changes.  Musculoskeletal: Negative for gait problem or joint swelling.  Skin: Negative for rash.  Neurological: Negative for dizziness or headache.  No other specific complaints in a complete review of systems (except as listed in HPI above).   Objective  Vitals:    09/23/22 0946  BP: 120/76  Pulse: 82  Resp: 16  Temp: 97.8 F (36.6 C)  TempSrc: Oral  SpO2: 96%  Weight: 136 lb 4.8 oz (61.8 kg)  Height: 5' (1.524 m)    Body mass index is 26.62 kg/m.  Physical Exam  Constitutional: Patient appears well-developed and well-nourished. No distress.  HENT: Head: Normocephalic and atraumatic. Ears: B TMs ok, no erythema or effusion; Nose: Nose normal. Mouth/Throat: Oropharynx is clear and moist. No oropharyngeal exudate.  Eyes: Conjunctivae and EOM are normal. Pupils are equal, round, and reactive to light. No scleral icterus.  Neck: Normal range of motion. Neck supple. No JVD present. No thyromegaly present.  Cardiovascular: Normal rate, regular rhythm and normal heart sounds.  No murmur heard. No BLE edema. Pulmonary/Chest: Effort normal and breath sounds normal. No respiratory distress. Abdominal: Soft. Bowel sounds are normal, no distension. There is no tenderness. no masses Breast: no lumps or masses, no nipple discharge or rashes Musculoskeletal: Normal range of motion, no joint effusions. No gross deformities Neurological: he is alert and oriented to person, place, and time. No cranial nerve deficit. Coordination, balance, strength, speech and gait are normal.  Skin: Skin is warm and dry. No rash noted. No erythema.  Psychiatric: Patient has a normal mood and affect. behavior is normal. Judgment and thought content normal.   No results found for this or any previous visit (from the past 2160 hour(s)).    Fall Risk:    09/23/2022    9:49 AM 09/22/2021   11:11 AM 08/24/2018    3:40 PM 08/01/2018    1:28 PM 02/27/2018    3:23 PM  Fall Risk   Falls in the past year? 0 0 No No No  Number falls in past yr: 0 0     Injury with Fall? 0 0     Follow up Falls evaluation completed Falls evaluation completed        Functional Status Survey: Is the patient deaf or have difficulty hearing?: No Does the patient have difficulty seeing, even when  wearing glasses/contacts?: No Does the patient have difficulty concentrating, remembering, or making decisions?: No Does the patient have difficulty walking or climbing stairs?: No Does the patient have difficulty dressing or bathing?: No   Assessment & Plan  1. Annual physical exam  - Lipid panel - CBC with Differential/Platelet - COMPLETE METABOLIC PANEL WITH GFR - Hemoglobin A1c - MM 3D SCREEN BREAST BILATERAL; Future  2. Screening for diabetes mellitus  - COMPLETE METABOLIC PANEL WITH GFR - Hemoglobin A1c  3. Screening for cholesterol level  - Lipid panel  4. Screening for deficiency anemia  - CBC with Differential/Platelet  5. Encounter for screening mammogram for malignant neoplasm of breast  - MM 3D SCREEN BREAST BILATERAL; Future  6. Need for influenza vaccination  - Flu Vaccine QUAD 6+ mos PF IM (Fluarix Quad PF)   -USPSTF grade A and B recommendations reviewed with patient; age-appropriate recommendations, preventive care, screening tests, etc discussed and encouraged; healthy living encouraged; see AVS for patient education given to patient -Discussed importance of 150 minutes of physical activity weekly, eat two servings of fish weekly, eat one serving of tree nuts ( cashews, pistachios, pecans, almonds.Marland Kitchen) every other  day, eat 6 servings of fruit/vegetables daily and drink plenty of water and avoid sweet beverages.

## 2022-09-23 ENCOUNTER — Encounter: Payer: Self-pay | Admitting: Nurse Practitioner

## 2022-09-23 ENCOUNTER — Ambulatory Visit (INDEPENDENT_AMBULATORY_CARE_PROVIDER_SITE_OTHER): Payer: 59 | Admitting: Nurse Practitioner

## 2022-09-23 ENCOUNTER — Other Ambulatory Visit: Payer: Self-pay

## 2022-09-23 VITALS — BP 120/76 | HR 82 | Temp 97.8°F | Resp 16 | Ht 60.0 in | Wt 136.3 lb

## 2022-09-23 DIAGNOSIS — Z0000 Encounter for general adult medical examination without abnormal findings: Secondary | ICD-10-CM | POA: Diagnosis not present

## 2022-09-23 DIAGNOSIS — Z13220 Encounter for screening for lipoid disorders: Secondary | ICD-10-CM | POA: Diagnosis not present

## 2022-09-23 DIAGNOSIS — Z130 Encounter for screening for diseases of the blood and blood-forming organs and certain disorders involving the immune mechanism: Secondary | ICD-10-CM | POA: Diagnosis not present

## 2022-09-23 DIAGNOSIS — Z23 Encounter for immunization: Secondary | ICD-10-CM | POA: Diagnosis not present

## 2022-09-23 DIAGNOSIS — Z131 Encounter for screening for diabetes mellitus: Secondary | ICD-10-CM

## 2022-09-23 DIAGNOSIS — Z1231 Encounter for screening mammogram for malignant neoplasm of breast: Secondary | ICD-10-CM

## 2022-09-24 LAB — COMPLETE METABOLIC PANEL WITH GFR
AG Ratio: 1.7 (calc) (ref 1.0–2.5)
ALT: 11 U/L (ref 6–29)
AST: 19 U/L (ref 10–35)
Albumin: 4.5 g/dL (ref 3.6–5.1)
Alkaline phosphatase (APISO): 73 U/L (ref 37–153)
BUN: 15 mg/dL (ref 7–25)
CO2: 30 mmol/L (ref 20–32)
Calcium: 10.1 mg/dL (ref 8.6–10.4)
Chloride: 105 mmol/L (ref 98–110)
Creat: 0.8 mg/dL (ref 0.50–1.03)
Globulin: 2.7 g/dL (calc) (ref 1.9–3.7)
Glucose, Bld: 94 mg/dL (ref 65–139)
Potassium: 4.3 mmol/L (ref 3.5–5.3)
Sodium: 141 mmol/L (ref 135–146)
Total Bilirubin: 0.4 mg/dL (ref 0.2–1.2)
Total Protein: 7.2 g/dL (ref 6.1–8.1)
eGFR: 85 mL/min/{1.73_m2} (ref >=60)

## 2022-09-24 LAB — LIPID PANEL
Cholesterol: 240 mg/dL — ABNORMAL HIGH (ref <200)
HDL: 119 mg/dL (ref >=50)
LDL Cholesterol (Calc): 107 mg/dL (calc) — ABNORMAL HIGH
Non-HDL Cholesterol (Calc): 121 mg/dL (calc) (ref <130)
Total CHOL/HDL Ratio: 2 (calc) (ref <5.0)
Triglycerides: 58 mg/dL (ref <150)

## 2022-09-24 LAB — CBC WITH DIFFERENTIAL/PLATELET
Absolute Monocytes: 442 cells/uL (ref 200–950)
Basophils Absolute: 29 cells/uL (ref 0–200)
Basophils Relative: 0.6 %
Eosinophils Absolute: 139 cells/uL (ref 15–500)
Eosinophils Relative: 2.9 %
HCT: 38.8 % (ref 35.0–45.0)
Hemoglobin: 12.7 g/dL (ref 11.7–15.5)
Lymphs Abs: 1685 cells/uL (ref 850–3900)
MCH: 27.2 pg (ref 27.0–33.0)
MCHC: 32.7 g/dL (ref 32.0–36.0)
MCV: 83.1 fL (ref 80.0–100.0)
MPV: 11.7 fL (ref 7.5–12.5)
Monocytes Relative: 9.2 %
Neutro Abs: 2506 cells/uL (ref 1500–7800)
Neutrophils Relative %: 52.2 %
Platelets: 247 10*3/uL (ref 140–400)
RBC: 4.67 10*6/uL (ref 3.80–5.10)
RDW: 15.2 % — ABNORMAL HIGH (ref 11.0–15.0)
Total Lymphocyte: 35.1 %
WBC: 4.8 10*3/uL (ref 3.8–10.8)

## 2022-09-24 LAB — HEMOGLOBIN A1C
Hgb A1c MFr Bld: 5.7 % of total Hgb — ABNORMAL HIGH (ref <5.7)
Mean Plasma Glucose: 117 mg/dL
eAG (mmol/L): 6.5 mmol/L

## 2022-10-26 ENCOUNTER — Ambulatory Visit
Admission: RE | Admit: 2022-10-26 | Discharge: 2022-10-26 | Disposition: A | Discharge status: Home / Self Care | Payer: 59 | Source: Ambulatory Visit | Attending: Nurse Practitioner | Admitting: Nurse Practitioner

## 2022-10-26 ENCOUNTER — Other Ambulatory Visit: Payer: Self-pay | Admitting: Surgery

## 2022-10-26 DIAGNOSIS — Z1231 Encounter for screening mammogram for malignant neoplasm of breast: Secondary | ICD-10-CM | POA: Insufficient documentation

## 2022-10-26 DIAGNOSIS — Z0000 Encounter for general adult medical examination without abnormal findings: Secondary | ICD-10-CM

## 2022-10-31 ENCOUNTER — Encounter
Admission: RE | Admit: 2022-10-31 | Discharge: 2022-10-31 | Disposition: A | Discharge status: Home / Self Care | Payer: 59 | Source: Ambulatory Visit | Attending: Surgery | Admitting: Surgery

## 2022-10-31 ENCOUNTER — Encounter: Payer: Self-pay | Admitting: Surgery

## 2022-10-31 DIAGNOSIS — R9431 Abnormal electrocardiogram [ECG] [EKG]: Secondary | ICD-10-CM | POA: Insufficient documentation

## 2022-10-31 DIAGNOSIS — Z01818 Encounter for other preprocedural examination: Secondary | ICD-10-CM | POA: Insufficient documentation

## 2022-10-31 DIAGNOSIS — Z01812 Encounter for preprocedural laboratory examination: Secondary | ICD-10-CM

## 2022-10-31 DIAGNOSIS — J984 Other disorders of lung: Secondary | ICD-10-CM | POA: Insufficient documentation

## 2022-10-31 HISTORY — DX: Sepsis, unspecified organism: A41.9

## 2022-10-31 LAB — URINALYSIS, ROUTINE W REFLEX MICROSCOPIC
Bilirubin Urine: NEGATIVE
Glucose, UA: 50 mg/dL — AB
Hgb urine dipstick: NEGATIVE
Ketones, ur: NEGATIVE mg/dL
Leukocytes,Ua: NEGATIVE
Nitrite: NEGATIVE
Protein, ur: NEGATIVE mg/dL
Specific Gravity, Urine: 1.003 — ABNORMAL LOW (ref 1.005–1.030)
pH: 7 (ref 5.0–8.0)

## 2022-10-31 LAB — SURGICAL PCR SCREEN
MRSA, PCR: NEGATIVE
Staphylococcus aureus: POSITIVE — AB

## 2022-10-31 LAB — TYPE AND SCREEN
ABO/RH(D): B POS
Antibody Screen: NEGATIVE

## 2022-10-31 NOTE — Patient Instructions (Addendum)
Your procedure is scheduled on:11-10-22 Thursday Report to the Registration Desk on the 1st floor of the [facility].Then proceed to the 2nd floor Surgery Desk  To find out your arrival time, please call [phone] between 1PM - 3PM on:11-09-22 Wednesday If your arrival time is 6:00 am, do not arrive prior to that time as the [place] entrance doors do not open until 6:00 am.  REMEMBER: Instructions that are not followed completely may result in serious medical risk, up to and including death; or upon the discretion of your surgeon and anesthesiologist your surgery may need to be rescheduled.  Do not eat food after midnight the night before surgery.  No gum chewing, lozengers or hard candies.  You may however, drink CLEAR liquids up to 2 hours before you are scheduled to arrive for your surgery. Do not drink anything within 2 hours of your scheduled arrival time.  Clear liquids include: - water  - apple juice without pulp - gatorade (not RED colors) - black coffee or tea (Do NOT add milk or creamers to the coffee or tea) Do NOT drink anything that is not on this list.  In addition, your doctor has ordered for you to drink the provided  Ensure Pre-Surgery Clear Carbohydrate Drink Drinking this carbohydrate drink up to two hours before surgery helps to reduce insulin resistance and improve patient outcomes. Please complete drinking 2 hours prior to scheduled arrival time.  Do NOT take any medication the day of surgery  One week prior to surgery: Stop Anti-inflammatories (NSAIDS) such as Advil, Aleve, Ibuprofen, Motrin, Naproxen, Naprosyn and Aspirin based products such as Excedrin, Goodys Powder, BC Powder.You may however, take Tylenol if needed for pain up until the day of surgery.  Stop ANY OVER THE COUNTER supplements/vitamins 7 days prior to surgery (Vitamin B12, Vitamin B1 and D)  No Alcohol for 24 hours before or after surgery.  No Smoking including e-cigarettes for 24  hours prior to surgery.  No chewable tobacco products for at least 6 hours prior to surgery.  No nicotine patches on the day of surgery.  Do not use any "recreational" drugs for at least a week prior to your surgery.  Please be advised that the combination of cocaine and anesthesia may have negative outcomes, up to and including death. If you test positive for cocaine, your surgery will be cancelled.  On the morning of surgery brush your teeth with toothpaste and water, you may rinse your mouth with mouthwash if you wish. Do not swallow any toothpaste or mouthwash.  Use CHG Soap as directed on instruction sheet/Total Shoulder Arthroplasty:  use Benzolyl Peroxide 5% Gel as directed on instruction sheet.  Do not wear jewelry, make-up, hairpins, clips or nail polish.  Do not wear lotions, powders, or perfumes.   Do not shave body from the neck down 48 hours prior to surgery just in case you cut yourself which could leave a site for infection.  Also, freshly shaved skin may become irritated if using the CHG soap.  Contact lenses, hearing aids and dentures may not be worn into surgery.  Do not bring valuables to the hospital. [facility] is not responsible for any missing/lost belongings or valuables.   Notify your doctor if there is any change in your medical condition (cold, fever, infection).  Wear comfortable clothing (specific to your surgery type) to the hospital.  After surgery, you can help prevent lung complications by doing breathing exercises.  Take deep breaths and cough every  1-2 hours. Your doctor may order a device called an Incentive Spirometer to help you take deep breaths. When coughing or sneezing, hold a pillow firmly against your incision with both hands. This is called "splinting." Doing this helps protect your incision. It also decreases belly discomfort.  If you are being admitted to the hospital overnight, leave your suitcase in the car. After surgery it may be  brought to your room.  If you are being discharged the day of surgery, you will not be allowed to drive home. You will need a responsible adult (18 years or older) to drive you home and stay with you that night.   If you are taking public transportation, you will need to have a responsible adult (18 years or older) with you. Please confirm with your physician that it is acceptable to use public transportation.   Please call the [facility] Dept. at [phone] if you have any questions about these instructions.  Surgery Visitation Policy:  Patients undergoing a surgery or procedure may have two family members or support persons with them as long as the person is not COVID-19 positive or experiencing its symptoms.   Inpatient Visitation:    Visiting hours are 7 a.m. to 8 p.m. Up to four visitors are allowed at one time in a patient room. The visitors may rotate out with other people during the day. One designated support person (adult) may remain overnight.  MASKING: Due to an increase in RSV rates and hospitalizations, starting Wednesday, Nov. 15, in patient care areas in which we serve newborns, infants and children, masks will be required for teammates and visitors.  Children ages 103 and under may not visit. This policy affects the following departments only:  [place] Postpartum area Mother Baby Unit Newborn nursery/Special care nursery  Other areas: Masks continue to be strongly recommended for [place] teammates, visitors and patients in all other areas. Visitation is not restricted outside of the units listed above.   How to Use an Incentive Spirometer An incentive spirometer is a tool that measures how well you are filling your lungs with each breath. Learning to take long, deep breaths using this tool can help you keep your lungs clear and active. This may help to reverse or lessen your chance of developing breathing (pulmonary)  problems, especially infection. You may be asked to use a spirometer: After a surgery. If you have a lung problem or a history of smoking. After a long period of time when you have been unable to move or be active. If the spirometer includes an indicator to show the highest number that you have reached, your health care provider or respiratory therapist will help you set a goal. Keep a log of your progress as told by your health care provider. What are the risks? Breathing too quickly may cause dizziness or cause you to pass out. Take your time so you do not get dizzy or light-headed. If you are in pain, you may need to take pain medicine before doing incentive spirometry. It is harder to take a deep breath if you are having pain. How to use your incentive spirometer  Sit up on the edge of your bed or on a chair. Hold the incentive spirometer so that it is in an upright position. Before you use the spirometer, breathe out normally. Place the mouthpiece in your mouth. Make sure your lips are closed tightly around it. Breathe in slowly and as deeply as you can  through your mouth, causing the piston or the ball to rise toward the top of the chamber. Hold your breath for 3-5 seconds, or for as long as possible. If the spirometer includes a coach indicator, use this to guide you in breathing. Slow down your breathing if the indicator goes above the marked areas. Remove the mouthpiece from your mouth and breathe out normally. The piston or ball will return to the bottom of the chamber. Rest for a few seconds, then repeat the steps 10 or more times. Take your time and take a few normal breaths between deep breaths so that you do not get dizzy or light-headed. Do this every 1-2 hours when you are awake. If the spirometer includes a goal marker to show the highest number you have reached (best effort), use this as a goal to work toward during each repetition. After each set of 10 deep breaths, cough a  few times. This will help to make sure that your lungs are clear. If you have an incision on your chest or abdomen from surgery, place a pillow or a rolled-up towel firmly against the incision when you cough. This can help to reduce pain while taking deep breaths and coughing. General tips When you are able to get out of bed: Walk around often. Continue to take deep breaths and cough in order to clear your lungs. Keep using the incentive spirometer until your health care provider says it is okay to stop using it. If you have been in the hospital, you may be told to keep using the spirometer at home. Contact a health care provider if: You are having difficulty using the spirometer. You have trouble using the spirometer as often as instructed. Your pain medicine is not giving enough relief for you to use the spirometer as told. You have a fever. Get help right away if: You develop shortness of breath. You develop a cough with bloody mucus from the lungs. You have fluid or blood coming from an incision site after you cough. Summary An incentive spirometer is a tool that can help you learn to take long, deep breaths to keep your lungs clear and active. You may be asked to use a spirometer after a surgery, if you have a lung problem or a history of smoking, or if you have been inactive for a long period of time. Use your incentive spirometer as instructed every 1-2 hours while you are awake. If you have an incision on your chest or abdomen, place a pillow or a rolled-up towel firmly against your incision when you cough. This will help to reduce pain. Get help right away if you have shortness of breath, you cough up bloody mucus, or blood comes from your incision when you cough. This information is not intended to replace advice given to you by your health care provider. Make sure you discuss any questions you have with your health care provider. Document Revised: 02/17/2020 Document Reviewed:  02/17/2020 Elsevier Patient Education  [place].

## 2022-11-10 DIAGNOSIS — Z01812 Encounter for preprocedural laboratory examination: Secondary | ICD-10-CM

## 2022-11-24 ENCOUNTER — Ambulatory Visit: Payer: 59

## 2022-11-24 ENCOUNTER — Other Ambulatory Visit: Payer: Self-pay

## 2022-11-24 ENCOUNTER — Ambulatory Visit: Payer: 59 | Admitting: General Practice

## 2022-11-24 ENCOUNTER — Ambulatory Visit: Payer: 59 | Admitting: Urgent Care

## 2022-11-24 ENCOUNTER — Encounter
Admission: RE | Disposition: A | Discharge status: Home / Self Care | Payer: Self-pay | Source: Ambulatory Visit | Attending: Surgery

## 2022-11-24 ENCOUNTER — Encounter: Payer: Self-pay | Admitting: Surgery

## 2022-11-24 ENCOUNTER — Ambulatory Visit
Admission: RE | Admit: 2022-11-24 | Discharge: 2022-11-24 | Disposition: A | Discharge status: Home / Self Care | Payer: 59 | Source: Ambulatory Visit | Attending: Surgery | Admitting: Surgery

## 2022-11-24 DIAGNOSIS — M19112 Post-traumatic osteoarthritis, left shoulder: Secondary | ICD-10-CM | POA: Diagnosis present

## 2022-11-24 DIAGNOSIS — Z96612 Presence of left artificial shoulder joint: Secondary | ICD-10-CM

## 2022-11-24 DIAGNOSIS — M7582 Other shoulder lesions, left shoulder: Secondary | ICD-10-CM | POA: Diagnosis not present

## 2022-11-24 DIAGNOSIS — Z01812 Encounter for preprocedural laboratory examination: Secondary | ICD-10-CM

## 2022-11-24 HISTORY — PX: TOTAL SHOULDER ARTHROPLASTY: SHX126

## 2022-11-24 LAB — TYPE AND SCREEN
ABO/RH(D): B POS
Antibody Screen: NEGATIVE

## 2022-11-24 SURGERY — ARTHROPLASTY, SHOULDER, TOTAL
Anesthesia: General | Site: Shoulder | Laterality: Left

## 2022-11-24 MED ORDER — BUPIVACAINE-EPINEPHRINE (PF) 0.5% -1:200000 IJ SOLN
INTRAMUSCULAR | Status: AC
  Filled 2022-11-24: qty 30

## 2022-11-24 MED ORDER — EPHEDRINE 5 MG/ML INJ
INTRAVENOUS | Status: AC
  Filled 2022-11-24: qty 5

## 2022-11-24 MED ORDER — SODIUM CHLORIDE 0.9 % IV SOLN
INTRAVENOUS | Status: DC | PRN
  Administered 2022-11-24: 100 mL via TOPICAL

## 2022-11-24 MED ORDER — ONDANSETRON HCL 4 MG/2ML IJ SOLN
INTRAMUSCULAR | Status: AC
  Filled 2022-11-24: qty 2

## 2022-11-24 MED ORDER — SODIUM CHLORIDE 0.9 % IR SOLN
Status: DC | PRN
  Administered 2022-11-24: 3000 mL

## 2022-11-24 MED ORDER — OXYCODONE HCL 5 MG PO TABS: 5.0000 mg | ORAL_TABLET | ORAL | Status: DC | PRN

## 2022-11-24 MED ORDER — KETOROLAC TROMETHAMINE 30 MG/ML IJ SOLN: 30.0000 mg | Freq: Once | INTRAMUSCULAR | Status: AC

## 2022-11-24 MED ORDER — BUPIVACAINE HCL (PF) 0.5 % IJ SOLN
INTRAMUSCULAR | Status: DC | PRN
  Administered 2022-11-24: 10 mL

## 2022-11-24 MED ORDER — BUPIVACAINE-EPINEPHRINE (PF) 0.5% -1:200000 IJ SOLN
INTRAMUSCULAR | Status: DC | PRN
  Administered 2022-11-24: 30 mL via PERINEURAL

## 2022-11-24 MED ORDER — OXYCODONE HCL 5 MG PO TABS: 5.0000 mg | ORAL_TABLET | ORAL | 0 refills | Status: AC | PRN

## 2022-11-24 MED ORDER — ONDANSETRON HCL 4 MG PO TABS: 4.0000 mg | ORAL_TABLET | Freq: Four times a day (QID) | ORAL | Status: DC | PRN

## 2022-11-24 MED ORDER — LIDOCAINE HCL (PF) 2 % IJ SOLN
INTRAMUSCULAR | Status: AC
  Filled 2022-11-24: qty 5

## 2022-11-24 MED ORDER — BUPIVACAINE LIPOSOME 1.3 % IJ SUSP
INTRAMUSCULAR | Status: AC
  Filled 2022-11-24: qty 20

## 2022-11-24 MED ORDER — CHLORHEXIDINE GLUCONATE 0.12 % MT SOLN: 15.0000 mL | Freq: Once | OROMUCOSAL | Status: AC

## 2022-11-24 MED ORDER — ONDANSETRON HCL 4 MG/2ML IJ SOLN: 4.0000 mg | Freq: Four times a day (QID) | INTRAMUSCULAR | Status: DC | PRN

## 2022-11-24 MED ORDER — FENTANYL CITRATE (PF) 100 MCG/2ML IJ SOLN
INTRAMUSCULAR | Status: AC
  Filled 2022-11-24: qty 2

## 2022-11-24 MED ORDER — PHENYLEPHRINE HCL (PRESSORS) 10 MG/ML IV SOLN
INTRAVENOUS | Status: AC
  Filled 2022-11-24: qty 1

## 2022-11-24 MED ORDER — CEFAZOLIN SODIUM-DEXTROSE 2-4 GM/100ML-% IV SOLN
INTRAVENOUS | Status: AC
  Filled 2022-11-24: qty 100

## 2022-11-24 MED ORDER — BUPIVACAINE HCL (PF) 0.5 % IJ SOLN
INTRAMUSCULAR | Status: AC
  Filled 2022-11-24: qty 10

## 2022-11-24 MED ORDER — KETOROLAC TROMETHAMINE 0.5 % OP SOLN: 1.0000 [drp] | Freq: Four times a day (QID) | OPHTHALMIC | Status: DC

## 2022-11-24 MED ORDER — PHENYLEPHRINE 80 MCG/ML (10ML) SYRINGE FOR IV PUSH (FOR BLOOD PRESSURE SUPPORT)
PREFILLED_SYRINGE | INTRAVENOUS | Status: AC
  Filled 2022-11-24: qty 10

## 2022-11-24 MED ORDER — SODIUM CHLORIDE 0.9 % IV SOLN: INTRAVENOUS | Status: DC

## 2022-11-24 MED ORDER — SUGAMMADEX SODIUM 200 MG/2ML IV SOLN
INTRAVENOUS | Status: DC | PRN
  Administered 2022-11-24: 140 mg via INTRAVENOUS

## 2022-11-24 MED ORDER — DEXAMETHASONE SODIUM PHOSPHATE 10 MG/ML IJ SOLN
INTRAMUSCULAR | Status: AC
  Filled 2022-11-24: qty 1

## 2022-11-24 MED ORDER — OXYCODONE HCL 5 MG PO TABS: 5.0000 mg | ORAL_TABLET | Freq: Once | ORAL | Status: DC | PRN

## 2022-11-24 MED ORDER — KETOROLAC TROMETHAMINE 0.5 % OP SOLN
1.0000 [drp] | Freq: Once | OPHTHALMIC | Status: AC
  Administered 2022-11-24: 1 [drp] via OPHTHALMIC
  Filled 2022-11-24: qty 3

## 2022-11-24 MED ORDER — MIDAZOLAM HCL 2 MG/2ML IJ SOLN
INTRAMUSCULAR | Status: AC
  Filled 2022-11-24: qty 2

## 2022-11-24 MED ORDER — CEFAZOLIN SODIUM-DEXTROSE 2-4 GM/100ML-% IV SOLN: 2.0000 g | Freq: Four times a day (QID) | INTRAVENOUS | Status: DC

## 2022-11-24 MED ORDER — PROPOFOL 10 MG/ML IV BOLUS
INTRAVENOUS | Status: AC
  Filled 2022-11-24: qty 20

## 2022-11-24 MED ORDER — ROCURONIUM BROMIDE 100 MG/10ML IV SOLN
INTRAVENOUS | Status: DC | PRN
  Administered 2022-11-24: 40 mg via INTRAVENOUS

## 2022-11-24 MED ORDER — BUPIVACAINE LIPOSOME 1.3 % IJ SUSP
INTRAMUSCULAR | Status: DC | PRN
  Administered 2022-11-24: 20 mL

## 2022-11-24 MED ORDER — ONDANSETRON HCL 4 MG/2ML IJ SOLN
INTRAMUSCULAR | Status: DC | PRN
  Administered 2022-11-24: 4 mg via INTRAVENOUS

## 2022-11-24 MED ORDER — MIDAZOLAM HCL 2 MG/2ML IJ SOLN
1.0000 mg | Freq: Once | INTRAMUSCULAR | Status: AC
  Administered 2022-11-24: 1 mg via INTRAVENOUS

## 2022-11-24 MED ORDER — PROPOFOL 10 MG/ML IV BOLUS
INTRAVENOUS | Status: DC | PRN
  Administered 2022-11-24: 140 mg via INTRAVENOUS

## 2022-11-24 MED ORDER — LACTATED RINGERS IV SOLN: INTRAVENOUS | Status: DC

## 2022-11-24 MED ORDER — LIDOCAINE HCL (CARDIAC) PF 100 MG/5ML IV SOSY
PREFILLED_SYRINGE | INTRAVENOUS | Status: DC | PRN
  Administered 2022-11-24: 60 mg via INTRAVENOUS

## 2022-11-24 MED ORDER — PHENYLEPHRINE HCL (PRESSORS) 10 MG/ML IV SOLN
INTRAVENOUS | Status: DC | PRN
  Administered 2022-11-24: 160 ug via INTRAVENOUS
  Administered 2022-11-24 (×2): 80 ug via INTRAVENOUS

## 2022-11-24 MED ORDER — FENTANYL CITRATE (PF) 100 MCG/2ML IJ SOLN: 25.0000 ug | INTRAMUSCULAR | Status: DC | PRN

## 2022-11-24 MED ORDER — DEXAMETHASONE SODIUM PHOSPHATE 10 MG/ML IJ SOLN
INTRAMUSCULAR | Status: DC | PRN
  Administered 2022-11-24: 5 mg via INTRAVENOUS

## 2022-11-24 MED ORDER — FAMOTIDINE 20 MG PO TABS: 20.0000 mg | ORAL_TABLET | Freq: Once | ORAL | Status: AC

## 2022-11-24 MED ORDER — TRANEXAMIC ACID 1000 MG/10ML IV SOLN
INTRAVENOUS | Status: AC
  Filled 2022-11-24: qty 10

## 2022-11-24 MED ORDER — TRANEXAMIC ACID 1000 MG/10ML IV SOLN
INTRAVENOUS | Status: DC | PRN
  Administered 2022-11-24: 1000 mg via TOPICAL

## 2022-11-24 MED ORDER — FENTANYL CITRATE PF 50 MCG/ML IJ SOSY
PREFILLED_SYRINGE | INTRAMUSCULAR | Status: AC
  Administered 2022-11-24: 50 ug via INTRAVENOUS
  Filled 2022-11-24: qty 1

## 2022-11-24 MED ORDER — KETOROLAC TROMETHAMINE 30 MG/ML IJ SOLN
INTRAMUSCULAR | Status: AC
  Administered 2022-11-24: 30 mg via INTRAVENOUS
  Filled 2022-11-24: qty 1

## 2022-11-24 MED ORDER — OXYCODONE HCL 5 MG/5ML PO SOLN: 5.0000 mg | Freq: Once | ORAL | Status: DC | PRN

## 2022-11-24 MED ORDER — PHENYLEPHRINE HCL-NACL 20-0.9 MG/250ML-% IV SOLN
INTRAVENOUS | Status: DC | PRN
  Administered 2022-11-24: 50 ug/min via INTRAVENOUS

## 2022-11-24 MED ORDER — METOCLOPRAMIDE HCL 5 MG/ML IJ SOLN: 5.0000 mg | Freq: Three times a day (TID) | INTRAMUSCULAR | Status: DC | PRN

## 2022-11-24 MED ORDER — FENTANYL CITRATE PF 50 MCG/ML IJ SOSY: 50.0000 ug | PREFILLED_SYRINGE | Freq: Once | INTRAMUSCULAR | Status: AC

## 2022-11-24 MED ORDER — METOCLOPRAMIDE HCL 10 MG PO TABS: 5.0000 mg | ORAL_TABLET | Freq: Three times a day (TID) | ORAL | Status: DC | PRN

## 2022-11-24 MED ORDER — FAMOTIDINE 20 MG PO TABS
ORAL_TABLET | ORAL | Status: AC
  Administered 2022-11-24: 20 mg via ORAL
  Filled 2022-11-24: qty 1

## 2022-11-24 MED ORDER — CEFAZOLIN SODIUM-DEXTROSE 2-4 GM/100ML-% IV SOLN
INTRAVENOUS | Status: AC
  Administered 2022-11-24: 2 g via INTRAVENOUS
  Filled 2022-11-24: qty 100

## 2022-11-24 MED ORDER — KETOROLAC TROMETHAMINE 15 MG/ML IJ SOLN
INTRAMUSCULAR | Status: AC
  Filled 2022-11-24: qty 1

## 2022-11-24 MED ORDER — MIDAZOLAM HCL 2 MG/2ML IJ SOLN
INTRAMUSCULAR | Status: AC
  Administered 2022-11-24: 1 mg via INTRAVENOUS
  Filled 2022-11-24: qty 2

## 2022-11-24 MED ORDER — ORAL CARE MOUTH RINSE: 15.0000 mL | Freq: Once | OROMUCOSAL | Status: AC

## 2022-11-24 MED ORDER — FENTANYL CITRATE (PF) 100 MCG/2ML IJ SOLN
INTRAMUSCULAR | Status: DC | PRN
  Administered 2022-11-24 (×2): 50 ug via INTRAVENOUS

## 2022-11-24 MED ORDER — CEFAZOLIN SODIUM-DEXTROSE 2-4 GM/100ML-% IV SOLN
2.0000 g | INTRAVENOUS | Status: AC
  Administered 2022-11-24: 2 g via INTRAVENOUS

## 2022-11-24 MED ORDER — ROCURONIUM BROMIDE 10 MG/ML (PF) SYRINGE
PREFILLED_SYRINGE | INTRAVENOUS | Status: AC
  Filled 2022-11-24: qty 10

## 2022-11-24 MED ORDER — MIDAZOLAM HCL 2 MG/2ML IJ SOLN: 1.0000 mg | Freq: Once | INTRAMUSCULAR | Status: DC

## 2022-11-24 MED ORDER — 0.9 % SODIUM CHLORIDE (POUR BTL) OPTIME
TOPICAL | Status: DC | PRN
  Administered 2022-11-24: 500 mL

## 2022-11-24 MED ORDER — CHLORHEXIDINE GLUCONATE 0.12 % MT SOLN
OROMUCOSAL | Status: AC
  Administered 2022-11-24: 15 mL via OROMUCOSAL
  Filled 2022-11-24: qty 15

## 2022-11-24 SURGICAL SUPPLY — 63 items
APL PRP STRL LF DISP 70% ISPRP (MISCELLANEOUS) ×1
BAG DECANTER FOR FLEXI CONT (MISCELLANEOUS) ×1 IMPLANT
BLADE SAGITTAL WIDE XTHICK NO (BLADE) ×1 IMPLANT
BOWL CEMENT MIX W/ADAPTER (MISCELLANEOUS) ×1 IMPLANT
CEMENT BONE 40GM (Cement) IMPLANT
CHLORAPREP W/TINT 26 (MISCELLANEOUS) ×1 IMPLANT
COMPONENT NUMERAL SHLD 50X46 (Shoulder) IMPLANT
COOLER POLAR GLACIER W/PUMP (MISCELLANEOUS) ×1 IMPLANT
COVER BACK TABLE REUSABLE LG (DRAPES) ×1 IMPLANT
DRAPE 3/4 80X56 (DRAPES) ×2 IMPLANT
DRAPE INCISE IOBAN 66X45 STRL (DRAPES) ×2 IMPLANT
DRSG OPSITE POSTOP 4X8 (GAUZE/BANDAGES/DRESSINGS) ×1 IMPLANT
ELECT BLADE 6.5 EXT (BLADE) ×1 IMPLANT
ELECT CAUTERY BLADE 6.4 (BLADE) ×1 IMPLANT
ELECT REM PT RETURN 9FT ADLT (ELECTROSURGICAL) ×1
ELECTRODE REM PT RTRN 9FT ADLT (ELECTROSURGICAL) ×1 IMPLANT
GAUZE PACK 2X3YD (PACKING) ×1 IMPLANT
GLENOID HEAD 58-55 ART 19X20 (Shoulder) IMPLANT
GLOVE BIO SURGEON STRL SZ7.5 (GLOVE) ×4 IMPLANT
GLOVE BIO SURGEON STRL SZ8 (GLOVE) ×4 IMPLANT
GLOVE BIOGEL PI IND STRL 8 (GLOVE) ×1 IMPLANT
GLOVE SURG UNDER LTX SZ8 (GLOVE) ×1 IMPLANT
GOWN STRL REUS W/ TWL LRG LVL3 (GOWN DISPOSABLE) ×1 IMPLANT
GOWN STRL REUS W/ TWL XL LVL3 (GOWN DISPOSABLE) ×1 IMPLANT
GOWN STRL REUS W/TWL LRG LVL3 (GOWN DISPOSABLE) ×1
GOWN STRL REUS W/TWL XL LVL3 (GOWN DISPOSABLE) ×1
HOOD PEEL AWAY T7 (MISCELLANEOUS) ×3 IMPLANT
HUMERAL COMP SHLD 50X46 (Shoulder) ×1 IMPLANT
IV NS 100ML SINGLE PACK (IV SOLUTION) ×1 IMPLANT
IV NS IRRIG 3000ML ARTHROMATIC (IV SOLUTION) ×1 IMPLANT
KIT INST INFERIOR GLENOID SHLD (INSTRUMENTS) IMPLANT
KIT STABILIZATION SHOULDER (MISCELLANEOUS) ×1 IMPLANT
MANIFOLD NEPTUNE II (INSTRUMENTS) ×1 IMPLANT
MASK FACE SPIDER DISP (MASK) ×1 IMPLANT
MAT ABSORB  FLUID 56X50 GRAY (MISCELLANEOUS) ×1
MAT ABSORB FLUID 56X50 GRAY (MISCELLANEOUS) ×1 IMPLANT
NDL SAFETY ECLIP 18X1.5 (MISCELLANEOUS) ×1 IMPLANT
NDL SPNL 20GX3.5 QUINCKE YW (NEEDLE) ×1 IMPLANT
NEEDLE SPNL 20GX3.5 QUINCKE YW (NEEDLE) ×1 IMPLANT
NS IRRIG 500ML POUR BTL (IV SOLUTION) ×1 IMPLANT
PACK ARTHROSCOPY SHOULDER (MISCELLANEOUS) ×1 IMPLANT
PAD WRAPON POLAR SHDR UNIV (MISCELLANEOUS) ×1 IMPLANT
POST TAPER SHLD 12X32 (Post) IMPLANT
PULSAVAC PLUS IRRIG FAN TIP (DISPOSABLE) ×1
SLING ULTRA II M (MISCELLANEOUS) ×1 IMPLANT
SPONGE T-LAP 18X18 ~~LOC~~+RFID (SPONGE) ×2 IMPLANT
STAPLER SKIN PROX 35W (STAPLE) ×1 IMPLANT
STRAP SAFETY 5IN WIDE (MISCELLANEOUS) ×1 IMPLANT
SUT ETHIBOND 0 MO6 C/R (SUTURE) ×1 IMPLANT
SUT FIBERWIRE #2 38 BLUE 1/2 (SUTURE) ×5
SUT VIC AB 0 CT1 36 (SUTURE) ×2 IMPLANT
SUT VIC AB 2-0 CT1 27 (SUTURE) ×2
SUT VIC AB 2-0 CT1 TAPERPNT 27 (SUTURE) ×2 IMPLANT
SUTURE FIBERWR #2 38 BLUE 1/2 (SUTURE) ×2 IMPLANT
SYR 10ML LL (SYRINGE) ×1 IMPLANT
SYR 30ML LL (SYRINGE) ×2 IMPLANT
SYR TOOMEY IRRIG 70ML (MISCELLANEOUS) ×1
SYRINGE TOOMEY IRRIG 70ML (MISCELLANEOUS) ×1 IMPLANT
TAPE TRANSPORE STRL 2 31045 (GAUZE/BANDAGES/DRESSINGS) ×1 IMPLANT
TIP FAN IRRIG PULSAVAC PLUS (DISPOSABLE) ×1 IMPLANT
TRAP FLUID SMOKE EVACUATOR (MISCELLANEOUS) ×1 IMPLANT
WATER STERILE IRR 500ML POUR (IV SOLUTION) ×1 IMPLANT
WRAPON POLAR PAD SHDR UNIV (MISCELLANEOUS) ×1

## 2022-11-24 NOTE — Transfer of Care (Signed)
Immediate Anesthesia Transfer of Care Note  Patient: Jacqueline Rush  Procedure(s) Performed: TOTAL SHOULDER ARTHROPLASTY (Left: Shoulder)  Patient Location: PACU  Anesthesia Type:General  Level of Consciousness: awake and drowsy  Airway & Oxygen Therapy: Patient Spontanous Breathing and Patient connected to face mask oxygen  Post-op Assessment: Report given to RN and Post -op Vital signs reviewed and stable  Post vital signs: Reviewed and stable  Last Vitals:  Vitals Value Taken Time  BP 104/64 11/24/22 1015  Temp    Pulse 80 11/24/22 1015  Resp 12 11/24/22 1015  SpO2 99 % 11/24/22 1015  Vitals shown include unvalidated device data.  Last Pain:  Vitals:   11/24/22 0721  TempSrc:   PainSc: 0-No pain         Complications: No notable events documented.

## 2022-11-24 NOTE — Anesthesia Procedure Notes (Signed)
Procedure Name: Intubation Date/Time: 11/24/2022 7:35 AM  Performed by: Fredderick Phenix, CRNAPre-anesthesia Checklist: Patient identified, Emergency Drugs available, Suction available and Patient being monitored Patient Re-evaluated:Patient Re-evaluated prior to induction Oxygen Delivery Method: Circle system utilized Preoxygenation: Pre-oxygenation with 100% oxygen Induction Type: IV induction Ventilation: Mask ventilation without difficulty Laryngoscope Size: 3 and McGraph Grade View: Grade I Tube type: Oral Tube size: 7.0 mm Number of attempts: 1 Airway Equipment and Method: Stylet and Oral airway Placement Confirmation: ETT inserted through vocal cords under direct vision, positive ETCO2 and breath sounds checked- equal and bilateral Secured at: 21 cm Tube secured with: Tape Dental Injury: Teeth and Oropharynx as per pre-operative assessment

## 2022-11-24 NOTE — Anesthesia Preprocedure Evaluation (Signed)
Anesthesia Evaluation  Patient identified by MRN, date of birth, ID band Patient awake    Reviewed: Allergy & Precautions, H&P , NPO status , Patient's Chart, lab work & pertinent test results, reviewed documented beta blocker date and time   History of Anesthesia Complications Negative for: history of anesthetic complications  Airway Mallampati: II   Neck ROM: full    Dental no notable dental hx. (+) Dental Advidsory Given, Teeth Intact   Pulmonary neg pulmonary ROS, neg shortness of breath, neg sleep apnea, neg COPD   Pulmonary exam normal        Cardiovascular (-) angina (-) Past MI and (-) CABG negative cardio ROS Normal cardiovascular exam Rhythm:regular Rate:Normal     Neuro/Psych negative neurological ROS  negative psych ROS   GI/Hepatic negative GI ROS, Neg liver ROS,,,  Endo/Other  negative endocrine ROS    Renal/GU negative Renal ROS  negative genitourinary   Musculoskeletal negative musculoskeletal ROS (+)    Abdominal Normal abdominal exam  (+)   Peds  Hematology negative hematology ROS (+)   Anesthesia Other Findings Past Medical History: No date: Allergy No date: Arthritis No date: Family history of adverse reaction to anesthesia     Comment:  dad had hiccups for 10 days after surgery No date: Pre-diabetes No date: Varicose veins Past Surgical History: No date: BREAST CYST ASPIRATION; Left No date: CESAREAN SECTION No date: EXTERNAL EAR SURGERY     Comment:  pt states had right ear surgery 06/01/2016: KNEE ARTHROSCOPY WITH MEDIAL MENISECTOMY; Right     Comment:  Procedure: Arthroscopic partial medial meniscectomy,               arthroscopic abrasion chondroplasty of femoral trochlea,               and debridement of symptomatic plica, right knee.;                Surgeon: Corky Mull, MD;  Location: [facility];  Service: Orthopedics;  Laterality: Right; 06/30/2016:  SHOULDER ARTHROSCOPY WITH DEBRIDEMENT AND BICEP TENDON  REPAIR; Left     Comment:  Procedure: SHOULDER ARTHROSCOPY WITH DEBRIDEMENT AND               BICEP TENDON REPAIR;  Surgeon: Corky Mull, MD;                Location: [facility];  Service: Orthopedics;  Laterality:               Left; 06/30/2016: SHOULDER ARTHROSCOPY WITH LABRAL REPAIR; Left     Comment:  Procedure: SHOULDER ARTHROSCOPY WITH LABRAL REPAIR;                Surgeon: Corky Mull, MD;  Location: [facility];  Service:              Orthopedics;  Laterality: Left; BMI    Body Mass Index:  24.41 kg/m     Reproductive/Obstetrics negative OB ROS                             Anesthesia Physical Anesthesia Plan  ASA: 1  Anesthesia Plan: General ETT   Post-op Pain Management: Regional block*   Induction: Intravenous  PONV Risk Score and Plan: 3 and Ondansetron, Dexamethasone and Midazolam  Airway Management Planned: Oral ETT  Additional Equipment:   Intra-op  Plan:   Post-operative Plan: Extubation in OR  Informed Consent: I have reviewed the patients History and Physical, chart, labs and discussed the procedure including the risks, benefits and alternatives for the proposed anesthesia with the patient or authorized representative who has indicated his/her understanding and acceptance.     Dental Advisory Given  Plan Discussed with: Anesthesiologist, CRNA and Surgeon  Anesthesia Plan Comments: (Patient consented for risks of anesthesia including but not limited to:  - adverse reactions to medications - damage to eyes, teeth, lips or other oral mucosa - nerve damage due to positioning  - sore throat or hoarseness - Damage to heart, brain, nerves, lungs, other parts of body or loss of life  Patient voiced understanding.)        Anesthesia Quick Evaluation

## 2022-11-24 NOTE — Anesthesia Postprocedure Evaluation (Signed)
Anesthesia Post Note  Patient: Jacqueline Rush  Procedure(s) Performed: TOTAL SHOULDER ARTHROPLASTY (Left: Shoulder)  Patient location during evaluation: PACU Anesthesia Type: General Level of consciousness: awake and alert Pain management: pain level controlled Vital Signs Assessment: post-procedure vital signs reviewed and stable Respiratory status: spontaneous breathing, nonlabored ventilation, respiratory function stable and patient connected to nasal cannula oxygen Cardiovascular status: blood pressure returned to baseline and stable Postop Assessment: no apparent nausea or vomiting Anesthetic complications: no  No notable events documented.   Last Vitals:  Vitals:   11/24/22 1045 11/24/22 1117  BP: 113/72 125/81  Pulse: 80 86  Resp: 16 18  Temp:  36.4 C  SpO2: 94% 96%    Last Pain:  Vitals:   11/24/22 1117  TempSrc: Temporal  PainSc: 0-No pain                 Dimas Millin

## 2022-11-24 NOTE — Evaluation (Signed)
Occupational Therapy Evaluation Patient Details Name: Jacqueline Rush MRN: [id number] DOB: 01/28/1961 Today's Date: 11/24/2022   History of Present Illness Pt is 62 y/o female s/p L total shoulder arthroplasty.   Clinical Impression   Upon entering the room, pt seated in recliner chair with husband present in room. Pt is feeling well after surgery but is having some eye pain and low lighting in room for comfort. OT educated pt and caregiver on NWB precautions, sling use, polar care, and L hand/wrist/elbow exercises. Hand out provided as well. Pt getting dressed during session with education and husband demonstrates understanding of techniques. Pt remains seated in recliner chair at end of session and she along with husband feel comfortable with all education. No further concern at this time. OT to complete order at this time.      Recommendations for follow up therapy are one component of a multi-disciplinary discharge planning process, led by the attending physician.  Recommendations may be updated based on patient status, additional functional criteria and insurance authorization.   Follow Up Recommendations  Follow physician's recommendations for discharge plan and follow up therapies     Assistance Recommended at Discharge Intermittent Supervision/Assistance  Patient can return home with the following A little help with bathing/dressing/bathroom;Assistance with cooking/housework;Help with stairs or ramp for entrance;Assist for transportation       Equipment Recommendations  None recommended by OT       Precautions / Restrictions Precautions Precautions: Shoulder;Fall Shoulder Interventions: Don joy ultra sling;Shoulder abduction pillow;At all times;Off for dressing/bathing/exercises Restrictions Weight Bearing Restrictions: Yes LUE Weight Bearing: Non weight bearing      Mobility Bed Mobility               General bed mobility comments: seated in recliner chair     Transfers Overall transfer level: Modified independent                 General transfer comment: supervision      Balance Overall balance assessment: No apparent balance deficits (not formally assessed)                                         ADL either performed or assessed with clinical judgement   ADL Overall ADL's : Needs assistance/impaired                                       General ADL Comments: Pt already had LB clothing donned. Hands on education with pt's husband for UB bathing and dressing tasks with placement of polar care and sling     Vision Patient Visual Report: No change from baseline              Pertinent Vitals/Pain Pain Assessment Pain Assessment: Faces Faces Pain Scale: Hurts little more Pain Location: R eye Pain Descriptors / Indicators: Discomfort Pain Intervention(s): Other (comment) (eye drops)     Hand Dominance Right   Extremity/Trunk Assessment Upper Extremity Assessment Upper Extremity Assessment: Overall WFL for tasks assessed;LUE deficits/detail LUE Deficits / Details: surgical intervention and is NWB with ROM restrictions and placed in sling   Lower Extremity Assessment Lower Extremity Assessment: Overall WFL for tasks assessed       Communication Communication Communication: No difficulties   Cognition Arousal/Alertness: Awake/alert Behavior During Therapy: WFL for tasks assessed/performed  Overall Cognitive Status: Within Functional Limits for tasks assessed                                                  Home Living Family/patient expects to be discharged to:: Private residence Living Arrangements: Spouse/significant other Available Help at Discharge: Family;Available 24 hours/day Type of Home: House Home Access: Stairs to enter [employer] of Steps: 3 Entrance Stairs-Rails: None Home Layout: One level     Bathroom Shower/Tub: Tub/shower  unit         Home Equipment: None          Prior Functioning/Environment Prior Level of Function : Independent/Modified Independent                                 OT Goals(Current goals can be found in the care plan section) Acute Rehab OT Goals Patient Stated Goal: to go home OT Goal Formulation: With patient/family Time For Goal Achievement: 11/24/22 Potential to Achieve Goals: Good  OT Frequency:         AM-PAC OT "6 Clicks" Daily Activity     Outcome Measure Help from another person eating meals?: None Help from another person taking care of personal grooming?: A Little Help from another person toileting, which includes using toliet, bedpan, or urinal?: A Little Help from another person bathing (including washing, rinsing, drying)?: A Little Help from another person to put on and taking off regular upper body clothing?: A Little Help from another person to put on and taking off regular lower body clothing?: A Little 6 Click Score: 19   End of Session Equipment Utilized During Treatment: Other (comment) (sling) Nurse Communication: Mobility status  Activity Tolerance: Patient tolerated treatment well Patient left: with call bell/phone within reach;in chair;with family/visitor present                   Time: 1225-1250 OT Time Calculation (min): 25 min Charges:  OT General Charges $OT Visit: 1 Visit OT Evaluation $OT Eval Low Complexity: 1 Low OT Treatments $Self Care/Home Management : 8-22 mins  Jacqueline Crocker, MS, OTR/L , CBIS ascom [phone]  11/24/22, 1:18 PM

## 2022-11-24 NOTE — H&P (Signed)
History of Present Illness: Jacqueline Rush is a 61 y.o. who presents today for history and physical. She is to undergo a left total shoulder arthroplasty on 11/10/2022. Since she was last here in the clinic there is been no change in her condition. She continues to have issues with the shoulder that is keeping her from doing her activities of daily living. Patient states that at night has denied her surgery at this stage. She was seen in physical therapy yesterday who did not recommend therapy at this time but recommended surgery.  Patient is noted to have a chronic history of issues with the left shoulder. She has had previous surgery for labral tear. The patient denies any change in her symptoms since her last visit 2.5 weeks ago. She denies any pain at rest on today's visit, but notes that her symptoms are worse at night as well as with activities at or above shoulder level. She continues to try to work out but notes that she has to greatly reduce the weight that she uses on her left arm as compared to her right. She notes recurrent episodes of "catching" as well as a feeling as though her shoulder wants to "come out of place". She denies any specific reinjury to the shoulder, and denies any numbness or paresthesias down her arm to her hand. 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  Past Medical History: Chickenpox   Past Surgical History: Arthroscopic partial medial meniscectomy, arthroscopic abrasion chondroplasty of femoral trochlea, and debridement of symptomatic plica, right knee. Right 06/01/2016 (Dr. Roland Rack)  extensive arthroscopic debridement, arthroscopic SLAP repair, arthroscopic bankart repair, and mini-open biceps tenodesis right shoulder Right 06/30/2016 (Dr. Roland Rack)  Irrigation and debridement with excision of spetic prepatellar bursa,left knee Left 08/09/2018 (Dr. Roland Rack)  [place]    Past Family History: Hyperlipidemia (Elevated cholesterol) Mother  Myocardial Infarction (Heart attack) Mother  High blood pressure (Hypertension) Mother  Colon cancer Father  Prostate cancer Father  Melanoma Father  Diabetes type II Father   Medications: acetaminophen (TYLENOL) 500 MG tablet Take by mouth  cyanocobalamin 2000 MCG tablet Take by mouth once daily  thiamine (VITAMIN B-1) 100 MG tablet Take by mouth once daily  VITAMIN D3 ORAL Take by mouth once daily  fluticasone propionate (FLONASE) 50 mcg/actuation nasal spray Place 1 spray into both nostrils 2 (two) times daily (Patient not taking: Reported on 11/08/2022) 16 g 0   No current Epic-ordered facility-administered medications on file.   Allergies Allergies  Allergen Reactions  Iodinated Contrast Media Swelling  Contrast dye- facial swelling  Codeine Other (See Comments)  Dye Swelling  Contrast dye- facial swelling   Review of Systems: A comprehensive 14 point ROS was performed, reviewed, and the pertinent orthopaedic findings are documented in the HPI.  Physical Exam: BP 132/86  Ht 152.4 cm (5')  Wt 59.1 kg (130 lb 6.4 oz)  BMI 25.47 kg/m   General: Well-developed well-nourished female seen in no acute distress.   HEENT: Atraumatic,normocephalic. Pupils are equal and reactive to light. Oropharynx is clear with moist mucosa  Lungs: Clear to auscultation bilaterally   Cardiovascular: Regular rate and rhythm. Normal S1, S2. No murmurs. No appreciable gallops or rubs. Peripheral pulses are palpable.  Abdomen: Soft, non-tender, nondistended. Bowel sounds present  Left shoulder exam: SKIN: Well-healed arthroscopic portal sites and surgical incision, otherwise unremarkable SWELLING: None WARMTH: None LYMPH NODES: No adenopathy palpable CREPITUS: Moderate  crepitance of the glenohumeral joint TENDERNESS: Mildly tender along anterior glenohumeral joint ROM (active):  Forward flexion: 140  degrees Abduction: 130 degrees Internal rotation: Left PSIS ROM (passive):  Forward flexion: 170 degrees Abduction: 165 degrees  ER/IR at 90 abd: 80 degrees/30 degrees  She describes mild-moderate pain at the extremes of all motions.  STRENGTH: Forward flexion: 5/5 Abduction: 5/5 External rotation: 5/5 Internal rotation: 5/5 Pain with RC testing: No  STABILITY: Normal  SPECIAL TESTS: Luan Pulling' test: Mildly positive Speed's test: Not evaluated Capsulitis - pain w/ passive ER: No Crossed arm test: Moderately positive Crank: Not evaluated Anterior apprehension: Negative Posterior apprehension: Not evaluated  Neurological: The patient is alert and oriented Sensation to light touch appears to be intact and within normal limits Gross motor strength appeared to be equal to 5/5  Vascular : Peripheral pulses felt to be palpable. Capillary refill appears to be intact and within normal limits  X-ray: MRI of the left shoulder recently taken demonstrates evidence of severe degenerative joint disease of the glenohumeral joint with multiple loose bodies and a moderate effusion. There also is a larger loose body measuring 2.7 cm in greatest diameter in the inferior axillary recess. There is evidence of "moderate-severe tendinosis" of the supraspinatus without evidence of partial or full-thickness tearing. Mild tendinosis of the infraspinatus tendon also is noted.   Impression: 1. Posttraumatic arthrosis left shoulder.  Plan:  The treatment options were discussed with the patient. In addition, patient educational materials were provided regarding the diagnosis and treatment options. The patient is quite frustrated by her symptoms and functional limitations, and would like to consider alternative treatment options. Therefore, I have recommended a surgical procedure, specifically an anatomic left total shoulder arthroplasty. The procedure was discussed with the patient, as were the potential  risks (including bleeding, infection, nerve and/or blood vessel injury, persistent or recurrent pain, loosening and/or failure of the components, dislocation, need for further surgery, blood clots, strokes, heart attacks and/or arhythmias, pneumonia, etc.) and benefits. The patient states her understanding and agrees to proceed. All of the patient's questions and concerns were answered. She can call any time with further concerns. She will follow up post-surgery routine.    H&P reviewed and patient re-examined. No changes.

## 2022-11-24 NOTE — Discharge Instructions (Addendum)
Orthopedic discharge instructions: May shower with intact OpSite dressing once nerve block has worn off (around post-op day #4 - Monday).  Apply ice frequently to shoulder or use Polar Care device. Take ibuprofen 600-800 mg TID with meals for 5-7 days, then as necessary. Take oxycodone as prescribed when needed.  May supplement with ES Tylenol if necessary. Keep shoulder immobilizer on at all times except may remove for bathing purposes. Follow-up in 10-14 days or as scheduled.AMBULATORY SURGERY  DISCHARGE INSTRUCTIONS   The drugs that you were given will stay in your system until tomorrow so for the next 24 hours you should not:  Drive an automobile Make any legal decisions Drink any alcoholic beverage   You may resume regular meals tomorrow.  Today it is better to start with liquids and gradually work up to solid foods.  You may eat anything you prefer, but it is better to start with liquids, then soup and crackers, and gradually work up to solid foods.   Please notify your doctor immediately if you have any unusual bleeding, trouble breathing, redness and pain at the surgery site, drainage, fever, or pain not relieved by medication.     Your post-operative visit with Dr.                                       is: Date:                        Time:    Please call to schedule your post-operative visit.  Additional Instructions:       POLAR CARE INFORMATION  [email or web address]  How to use [place] Cold Therapy System?  YouTube   [email or web address]  OPERATING INSTRUCTIONS  Start the product With dry hands, connect the transformer to the electrical connection located on the top of the cooler. Next, plug the transformer into an appropriate electrical outlet. The unit will automatically start running at this point.  To stop the pump, disconnect electrical power.  Unplug to stop the product when not in use. Unplugging the Polar Care  unit turns it off. Always unplug immediately after use. Never leave it plugged in while unattended. Remove pad.    FIRST ADD WATER TO FILL LINE, THEN ICE---Replace ice when existing ice is almost melted  1 Discuss Treatment with your [facility] Practitioner and Use Only as Prescribed 2 Apply Insulation Barrier & Cold Therapy Pad 3 Check for Moisture 4 Inspect Skin Regularly  Tips and Trouble Shooting Usage Tips 1. Use cubed or chunked ice for optimal performance. 2. It is recommended to drain the Pad between uses. To drain the pad, hold the Pad upright with the hose pointed toward the ground. Depress the black plunger and allow water to drain out. 3. You may disconnect the Pad from the unit without removing the pad from the affected area by depressing the silver tabs on the hose coupling and gently pulling the hoses apart. The Pad and unit will seal itself and will not leak. Note: Some dripping during release is normal. 4. DO NOT RUN PUMP WITHOUT WATER! The pump in this unit is designed to run with water. Running the unit without water will cause permanent damage to the pump. 5. Unplug unit before removing lid.  TROUBLESHOOTING GUIDE Pump not running, Water not flowing to the pad, Pad is not getting cold 1. Make  sure the transformer is plugged into the wall outlet. 2. Confirm that the ice and water are filled to the indicated levels. 3. Make sure there are no kinks in the pad. 4. Gently pull on the blue tube to make sure the tube/pad junction is straight. 5. Remove the pad from the treatment site and ll it while the pad is lying at; then reapply. 6. Confirm that the pad couplings are securely attached to the unit. Listen for the double clicks (Figure 1) to confirm the pad couplings are securely attached.  Leaks    Note: Some condensation on the lines, controller, and pads is unavoidable, especially in warmer climates. 1. If using a [place] Polar Care Cold Therapy unit with a  detachable Cold Therapy Pad, and a leak exists (other than condensation on the lines) disconnect the pad couplings. Make sure the silver tabs on the couplings are depressed before reconnecting the pad to the pump hose; then confirm both sides of the coupling are properly clicked in. 2. If the coupling continues to leak or a leak is detected in the pad itself, stop using it and call [facility] at [phone].  Cleaning After use, empty and dry the unit with a soft cloth. Warm water and mild detergent may be used occasionally to clean the pump and tubes.  WARNING: The [place] can be cold enough to cause serious injury, including full skin necrosis. Follow these Operating Instructions, and carefully read the Product Insert (see pouch on side of unit) and the Cold Therapy Pad Fitting Instructions (provided with each Cold Therapy Pad) prior to use.                 Interscalene Nerve Block with Exparel   For your surgery you have received an Interscalene Nerve Block with Exparel. Nerve Blocks affect many types of nerves, including nerves that control movement, pain and normal sensation.  You may experience feelings such as numbness, tingling, heaviness, weakness or the inability to move your arm or the feeling or sensation that your arm has "fallen asleep". A nerve block with Exparel can last up to 5 days.  Usually the weakness wears off first.  The tingling and heaviness usually wear off next.  Finally you may start to notice pain.  Keep in mind that this may occur in any order.  Once a nerve block starts to wear off it is usually completely gone within 60 minutes. ISNB may cause mild shortness of breath, a hoarse voice, blurry vision, unequal pupils, or drooping of the face on the same side as the nerve block.  These symptoms will usually resolve with the numbness.  Very rarely the procedure itself can cause mild seizures. If needed, your surgeon will give you a prescription  for pain medication.  It will take about 60 minutes for the oral pain medication to become fully effective.  So, it is recommended that you start taking this medication before the nerve block first begins to wear off, or when you first begin to feel discomfort. Take your pain medication only as prescribed.  Pain medication can cause sedation and decrease your breathing if you take more than you need for the level of pain that you have. Nausea is a common side effect of many pain medications.  You may want to eat something before taking your pain medicine to prevent nausea. After an Interscalene nerve block, you cannot feel pain, pressure or extremes in temperature in the effected arm.  Because your arm is numb it is at an increased risk for injury.  To decrease the possibility of injury, please practice the following:  While you are awake change the position of your arm frequently to prevent too much pressure on any one area for prolonged periods of time.  If you have a cast or tight dressing, check the color or your fingers every couple of hours.  Call your surgeon with the appearance of any discoloration (white or blue). If you are given a sling to wear before you go home, please wear it  at all times until the block has completely worn off.  Do not get up at night without your sling. Please contact [facility] or your surgeon if you do not begin to regain sensation after 7 days from the surgery.  Anesthesia may be contacted by calling the Same Day Surgery Department, Mon. through Fri., 6 am to 4 pm at [phone].   If you experience any other problems or concerns, please contact your surgeon's office. If you experience severe or prolonged shortness of breath go to the nearest emergency department.

## 2022-11-24 NOTE — Op Note (Signed)
11/24/2022  9:54 AM  Patient:   Jacqueline Rush  Pre-Op Diagnosis:   Severe degenerative joint disease, left shoulder.  Post-Op Diagnosis:   Same.  Procedure:   Anatomic left total shoulder arthroplasty.  Surgeon:   Pascal Lux, MD  Assistant:   Cameron Proud, PA-C  Anesthesia:   General endotracheal with an interscalene block using Exparel placed preoperatively by the anesthesiologist.  Findings:   As above.  The rotator cuff was in satisfactory condition.  Complications:   None  EBL:   50 cc  Fluids:   900 cc crystalloid  UOP:   None  TT:   None  Drains:   None  Closure:   Staples  Implants:   Ovo-Motion 46 x 50 mm press fit humeral head and a 20 mm cemented glenoid hemi-cap.  Brief Clinical Note:   The patient is a 61 year old female with a long history of gradually worsening left shoulder pain and stiffness. The patient's symptoms have progressed despite medications, activity modification, etc. The patient's history and examination are consistent with advanced degenerative joint disease confirmed by plain radiographs. A preoperative MRI scan showed that the rotator cuff remained in satisfactory condition. The patient presents at this time for an anatomic left total shoulder arthroplasty.  Procedure:   The patient underwent placement of an interscalene block using Exparel by the anesthesiologist in the preoperative holding area before being brought into the operating room and lain in the supine position. The patient then underwent general endotracheal intubation and anesthesia before the patient was repositioned in the beach chair position using the beach chair positioner. The left shoulder and upper extremity were prepped with ChloraPrep solution before being draped sterilely. Preoperative antibiotics were administered.   A timeout was performed to verify the appropriate surgical site before a standard anterior approach to the shoulder was made through an  approximately 4-5 inch incision. The incision was carried down through the subcutaneous tissues to expose the deltopectoral fascia. The interval between the deltoid and pectoralis muscles was identified and this plane developed, retracting the cephalic vein laterally with the deltoid muscle. The conjoined tendon was identified. Its lateral margin was dissected and the Kolbel self-retraining retractor inserted. The "three sisters" were identified and cauterized. Bursal tissues were removed to improve visualization.   The biceps tendon was identified near the inferior aspect of the bicipital groove. A soft tissue tenodesis was performed by attaching the biceps tendon to the adjacent pectoralis major tendon using two #0 Ethibond interrupted sutures. The biceps tendon was then transected just proximal to the tenodesis site. The subscapularis tendon was released from its attachment to the lesser tuberosity 1 cm proximal to its insertion and several tagging sutures placed. The inferior capsule was released with care after identifying and protecting the axillary nerve.   The proximal humerus was carefully measured and found to be optimally replicated by the 46 x 50 mm humeral head. The appropriate guide was positioned and the central guidewire inserted. The centering shaft was advanced over the guidepin to the appropriate depth. The circumferential reamer was then advanced over the guidewire to ream down the humeral head before the access reamer was utilized and advanced fully. The centering shaft was removed along with the remaining bone plug before a protective metal plate was applied over the prepared surface.  Attention was directed to the glenoid. The labrum was debrided circumferentially before the drill guide was positioned. The guidewire was drilled into the glenoid to the appropriate depth. After verifying its  position, it was overreamed with the inferior glenoid reamer, making several stops to verify the  appropriate depth and checking the depth with the appropriate trial component. The trial was positioned and the flexible peg drill inserted and advanced fully. Several additional holes were placed using the appropriate punch to optimize cement fixation.  The glenoid was copiously irrigated with sterile saline solution using the pulse lavage before the surface was packed with a neosynephrine soaked piece of vaginal sponge. Meanwhile, the cement was mixed on the back table. When the cement was ready, a small amount of cement was inserted into the glenoid defect and pressurized digitally with the finger condom.  This process was repeated two more times before the permanent 20 mm component was cemented into place and held firmly using the pressurizer tool. The excess cement was removed using a Surveyor, quantity. Once the cemented hardened, the surface was carefully inspected and several small pieces of hardened cement removed.  Attention was redirected to the humeral side. The appropriate sized preparation trial was positioned and secured using several short guidepins. The pilot drill was inserted to the appropriate depth. The 12 mm taper post was then inserted and advanced to the appropriate depth. Finally, the permanent 46 x 50 mm humeral articular component was positioned appropriately and impacted into place to seat the component onto the taper post. The adequacy of fixation was verified manually and with an osteotome, and found to be excellent.  The shoulder was reduced and placed through a range of motion.  It was stable with abduction and external rotation, and did not appear to be too tight.  The wound was copiously irrigated with sterile saline solution using the jet lavage system before 30 cc of 0.5% Sensorcaine with epinephrine was injected into the pericapsular and peri-incisional tissues to help with postoperative analgesia. The subscapularis tendon was reapproximated using #2 FiberWire interrupted  sutures. The deltopectoral interval was closed using #0 Vicryl interrupted sutures before the subcutaneous tissues were closed using 2-0 Vicryl interrupted sutures. The skin was closed using staples. Prior to closing the skin, 1 g of transexemic acid in 10 cc of normal saline was injected intra-articularly to help with postoperative bleeding. A sterile occlusive dressing was applied to the wound before the arm was placed into a shoulder immobilizer with an abduction pillow. A Polar Care system also was applied to the shoulder. The patient was then transferred back to a hospital bed before being awakened, extubated, and returned to the recovery room in satisfactory condition after tolerating the procedure well.

## 2022-11-24 NOTE — Anesthesia Procedure Notes (Signed)
Anesthesia Regional Block: Interscalene brachial plexus block   Pre-Anesthetic Checklist: , timeout performed,  Correct Patient, Correct Site, Correct Laterality,  Correct Procedure, Correct Position, site marked,  Risks and benefits discussed,  Surgical consent,  Pre-op evaluation,  At surgeon's request and post-op pain management  Laterality: Left  Prep: chloraprep       Needles:  Injection technique: Single-shot  Needle Type: Echogenic Needle     Needle Length: 4cm  Needle Gauge: 25     Additional Needles:   Procedures:,,,, ultrasound used (permanent image in chart),,    Narrative:  Start time: 11/24/2022 7:20 AM End time: 11/24/2022 7:25 AM Injection made incrementally with aspirations every 5 mL.  Performed by: Personally  Anesthesiologist: Dimas Millin, MD  Additional Notes: Patient's chart reviewed and they were deemed appropriate candidate for procedure, at surgeon's request. Patient educated about risks, benefits, and alternatives of the block including but not limited to: temporary or permanent nerve damage, bleeding, infection, damage to surround tissues, pneumothorax, hemidiaphragmatic paralysis, unilateral Horner's syndrome, block failure, local anesthetic toxicity. Patient expressed understanding. A formal time-out was conducted consistent with institution rules.  Monitors were applied, and minimal sedation used (see nursing record). The site was prepped with skin prep and allowed to dry, and sterile gloves were used. A high frequency linear ultrasound probe with probe cover was utilized throughout. C5-7 nerve roots located and appeared anatomically normal, local anesthetic injected around them, and echogenic block needle trajectory was monitored throughout. Aspiration performed every 35m. Lung and blood vessels were avoided. All injections were performed without resistance and free of blood and paresthesias. The patient tolerated the procedure well.  Injectate:  213mexparel + 1019m.5% bupivacaine

## 2022-11-25 ENCOUNTER — Encounter: Payer: Self-pay | Admitting: Surgery

## 2022-11-28 LAB — SURGICAL PATHOLOGY

## 2023-09-26 NOTE — Progress Notes (Signed)
Name: Jacqueline Rush   MRN: [id number]    DOB: Jul 14, 1961   Date:09/29/2023       Progress Note  Subjective  Chief Complaint  Chief Complaint  Patient presents with   Annual Exam    HPI  Patient presents for annual CPE.  Diet: Regular, tries to eat well balanced  Exercise: 4 days 60 minutes  Last Eye Exam: May 2024 Last Dental Exam: June 2024  Flowsheet Row Office Visit from 09/29/2023 in [facility]  AUDIT-C Score 0      Depression: Phq 9 is  negative    09/29/2023    8:05 AM 09/23/2022    9:49 AM 09/22/2021   11:11 AM 08/24/2018    3:40 PM 08/01/2018    1:28 PM  Depression screen PHQ 2/9  Decreased Interest 0 0 0 0 0  Down, Depressed, Hopeless 0 0 0 0 0  PHQ - 2 Score 0 0 0 0 0  Altered sleeping   0  0  Tired, decreased energy   0  0  Change in appetite   0  0  Feeling bad or failure about yourself    0  0  Trouble concentrating   0  0  Moving slowly or fidgety/restless   0  0  Suicidal thoughts   0  0  PHQ-9 Score   0  0  Difficult doing work/chores   Not difficult at all  Not difficult at all   Hypertension: BP Readings from Last 3 Encounters:  09/29/23 118/76  11/24/22 125/81  09/23/22 120/76   Obesity: Wt Readings from Last 3 Encounters:  09/29/23 134 lb 6.4 oz (61 kg)  11/24/22 127 lb 6.8 oz (57.8 kg)  09/23/22 136 lb 4.8 oz (61.8 kg)   BMI Readings from Last 3 Encounters:  09/29/23 26.25 kg/m  11/24/22 24.89 kg/m  09/23/22 26.62 kg/m     Vaccines:  HPV: up to at age 46 , ask insurance if age between 80-45  Shingrix: 25-64 yo and ask insurance if covered when patient above 12 yo Pneumonia:  educated and discussed with patient. Flu:  educated and discussed with patient.   COVID-19:03/31/2020, 03/06/2020   Hep C Screening: completed STD testing and prevention (HIV/chl/gon/syphilis): completed Intimate partner violence: negative screen  Sexual History : sexually active, reports vaginal dryness making  intercourse painful.  Will start estradiol cream.  Menstrual History/LMP/Abnormal Bleeding: postmenopausal Discussed importance of follow up if any post-menopausal bleeding: yes  Incontinence Symptoms: negative for symptoms   Breast cancer:  - Last Mammogram: 10/28/2022, due order placed - BRCA gene screening: none  Osteoporosis Prevention : Discussed high calcium and vitamin D supplementation, weight bearing exercises Bone density :will start screening at 65  Cervical cancer screening: 09/22/2021  Skin cancer: Discussed monitoring for atypical lesions  Colorectal cancer: 10/06/2021   Lung cancer:  Low Dose CT Chest recommended if Age 75-80 years, 20 pack-year currently smoking OR have quit w/in 15years. Patient does not qualify for screen   ECG: 11/25/2022   Advanced Care Planning: A voluntary discussion about advance care planning including the explanation and discussion of advance directives.  Discussed health care proxy and Living will, and the patient was able to identify a health care proxy as husband.  Patient does not have a living will and power of attorney of health care   Lipids: Lab Results  Component Value Date   CHOL 240 (H) 09/23/2022   CHOL 232 (H) 09/22/2021  CHOL 189 01/24/2017   Lab Results  Component Value Date   HDL 119 09/23/2022   HDL 119 09/22/2021   HDL 107 01/24/2017   Lab Results  Component Value Date   LDLCALC 107 (H) 09/23/2022   LDLCALC 97 09/22/2021   LDLCALC 68 01/24/2017   Lab Results  Component Value Date   TRIG 58 09/23/2022   TRIG 73 09/22/2021   TRIG 68 01/24/2017   Lab Results  Component Value Date   CHOLHDL 2.0 09/23/2022   CHOLHDL 1.9 09/22/2021   CHOLHDL 1.8 01/24/2017   No results found for: "LDLDIRECT"  Glucose: Glucose  Date Value Ref Range Status  09/23/2014 85 65 - 99 mg/dL Final   Glucose, Bld  Date Value Ref Range Status  09/23/2022 94 65 - 139 mg/dL Final    Comment:    .        Non-fasting reference  interval .   09/22/2021 96 65 - 99 mg/dL Final    Comment:    .            Fasting reference interval .   08/07/2018 96 70 - 99 mg/dL Final   Glucose-Capillary  Date Value Ref Range Status  08/09/2018 103 (H) 70 - 99 mg/dL Final  81/19/1478 295 (H) 70 - 99 mg/dL Final    Patient Active Problem List   Diagnosis Date Noted   Status post total shoulder arthroplasty, left 11/24/2022   Family history of colon cancer    Polyp of transverse colon    Septic prepatellar bursitis of left knee 08/09/2018   Sepsis due to cellulitis (HCC) 08/02/2018   Seborrheic keratosis 02/27/2018   Breast cancer screening 01/24/2017   Post-traumatic osteoarthritis of left shoulder 07/01/2016   Primary osteoarthritis of right knee 05/27/2016   Complex tear of medial meniscus of right knee as current injury 05/16/2016   Rotator cuff tendinitis, left 05/16/2016   Tear of left glenoid labrum 05/16/2016   Annual physical exam 12/09/2015   Encounter for screening mammogram for malignant neoplasm of breast 12/09/2015   Skin lesion 12/09/2015   Plantar warts 12/09/2015   Need for immunization against influenza 12/09/2015   Arthritis, multiple joint involvement 12/01/2015   Varicose vein 12/01/2015   Allergic rhinitis 12/01/2015   Prediabetes 12/01/2015    Past Surgical History:  Procedure Laterality Date   BREAST CYST ASPIRATION Left    CESAREAN SECTION     COLONOSCOPY WITH PROPOFOL N/A 10/06/2021   Procedure: COLONOSCOPY WITH PROPOFOL;  Surgeon: Pasty Spillers, MD;  Location: [facility];  Service: Endoscopy;  Laterality: N/A;   EXTERNAL EAR SURGERY     pt states had right ear surgery   IRRIGATION AND DEBRIDEMENT KNEE Left 08/09/2018   Procedure: IRRIGATION AND DEBRIDEMENT KNEE WITH BURSA EXCISION;  Surgeon: Christena Flake, MD;  Location: [facility];  Service: Orthopedics;  Laterality: Left;   KNEE ARTHROSCOPY WITH MEDIAL MENISECTOMY Right 06/01/2016   Procedure: Arthroscopic partial medial  meniscectomy, arthroscopic abrasion chondroplasty of femoral trochlea, and debridement of symptomatic plica, right knee.;  Surgeon: Christena Flake, MD;  Location: [facility];  Service: Orthopedics;  Laterality: Right;   SHOULDER ARTHROSCOPY WITH DEBRIDEMENT AND BICEP TENDON REPAIR Left 06/30/2016   Procedure: SHOULDER ARTHROSCOPY WITH DEBRIDEMENT AND BICEP TENDON REPAIR;  Surgeon: Christena Flake, MD;  Location: [facility];  Service: Orthopedics;  Laterality: Left;   SHOULDER ARTHROSCOPY WITH LABRAL REPAIR Left 06/30/2016   Procedure: SHOULDER ARTHROSCOPY WITH LABRAL REPAIR;  Surgeon: Excell Seltzer  Poggi, MD;  Location: [facility];  Service: Orthopedics;  Laterality: Left;   TOTAL SHOULDER ARTHROPLASTY Left 11/24/2022   Procedure: TOTAL SHOULDER ARTHROPLASTY;  Surgeon: Christena Flake, MD;  Location: [facility];  Service: Orthopedics;  Laterality: Left;    Family History  Problem Relation Age of Onset   Hypertension Mother    Heart disease Mother        heart attack in 2017   Cancer Father        colon, prostate, skin   Diabetes Father    Colon cancer Father    Prostate cancer Father    Ulcerative colitis Daughter    Cancer Maternal Uncle        prostate and colon   Colon cancer Maternal Uncle    Prostate cancer Maternal Uncle    Stroke Maternal Grandmother    Heart disease Maternal Grandfather        died from a heart attack   Cancer Paternal Grandfather        colon   Colon cancer Paternal Grandfather    Breast cancer Neg Hx     Social History   Socioeconomic History   Marital status: Married    Spouse name: John   Number of children: Not on file   Years of education: Not on file   Highest education level: Not on file  Occupational History   Not on file  Tobacco Use   Smoking status: Never   Smokeless tobacco: Never  Vaping Use   Vaping status: Never Used  Substance and Sexual Activity   Alcohol use: Yes    Comment: occasional   Drug use: No   Sexual activity: Yes     Partners: Male  Other Topics Concern   Not on file  Social History Narrative   Not on file   Social Determinants of Health   Financial Resource Strain: Low Risk  (09/29/2023)   Overall Financial Resource Strain (CARDIA)    Difficulty of Paying Living Expenses: Not hard at all  Food Insecurity: No Food Insecurity (09/29/2023)   Hunger Vital Sign    Worried About Running Out of Food in the Last Year: Never true    Ran Out of Food in the Last Year: Never true  Transportation Needs: No Transportation Needs (09/29/2023)   PRAPARE - Administrator, Civil Service (Medical): No    Lack of Transportation (Non-Medical): No  Physical Activity: Sufficiently Active (09/29/2023)   Exercise Vital Sign    Days of Exercise per Week: 4 days    Minutes of Exercise per Session: 60 min  Stress: No Stress Concern Present (09/29/2023)   [employer] of Occupational Health - Occupational Stress Questionnaire    Feeling of Stress : Only a little  Social Connections: Moderately Integrated (09/29/2023)   Social Connection and Isolation Panel [NHANES]    Frequency of Communication with Friends and Family: More than three times a week    Frequency of Social Gatherings with Friends and Family: More than three times a week    Attends Religious Services: 1 to 4 times per year    Active Member of [employer] or Organizations: No    Attends Banker Meetings: Never    Marital Status: Married  Catering manager Violence: Not At Risk (09/29/2023)   Humiliation, Afraid, Rape, and Kick questionnaire    Fear of Current or Ex-Partner: No    Emotionally Abused: No    Physically Abused: No    Sexually Abused:  No     Current Outpatient Medications:    Cyanocobalamin (VITAMIN B 12 PO), Take 1 tablet by mouth daily at 6 (six) AM., Disp: , Rfl:    Thiamine HCl (VITAMIN B1 PO), Take 1 tablet by mouth daily at 2 PM., Disp: , Rfl:    VITAMIN D PO, Take 1 tablet by mouth daily at 6 (six) AM., Disp:  , Rfl:    oxyCODONE (ROXICODONE) 5 MG immediate release tablet, Take 1-2 tablets (5-10 mg total) by mouth every 4 (four) hours as needed for moderate pain or severe pain. (Patient not taking: Reported on 09/29/2023), Disp: 40 tablet, Rfl: 0  Allergies  Allergen Reactions   Iodinated Contrast Media Swelling    Contrast dye- facial swelling   Codeine Other (See Comments)    "Didn't like how it made me feel"     ROS  Constitutional: Negative for fever or weight change.  Respiratory: Negative for cough and shortness of breath.   Cardiovascular: Negative for chest pain or palpitations.  Gastrointestinal: Negative for abdominal pain, no bowel changes.  Musculoskeletal: Negative for gait problem or joint swelling.  Skin: Negative for rash.  Neurological: Negative for dizziness or headache.  No other specific complaints in a complete review of systems (except as listed in HPI above).   Objective  Vitals:   09/29/23 0803  BP: 118/76  Pulse: 100  Resp: 16  Temp: 97.8 F (36.6 C)  TempSrc: Oral  SpO2: 97%  Weight: 134 lb 6.4 oz (61 kg)  Height: 5' (1.524 m)    Body mass index is 26.25 kg/m.  Physical Exam Constitutional: Patient appears well-developed and well-nourished. No distress.  HENT: Head: Normocephalic and atraumatic. Ears: B TMs ok, no erythema or effusion; Nose: Nose normal. Mouth/Throat: Oropharynx is clear and moist. No oropharyngeal exudate.  Eyes: Conjunctivae and EOM are normal. Pupils are equal, round, and reactive to light. No scleral icterus.  Neck: Normal range of motion. Neck supple. No JVD present. No thyromegaly present.  Cardiovascular: Normal rate, regular rhythm and normal heart sounds.  No murmur heard. No BLE edema. Pulmonary/Chest: Effort normal and breath sounds normal. No respiratory distress. Abdominal: Soft. Bowel sounds are normal, no distension. There is no tenderness. no masses Breast: no lumps or masses, no nipple discharge or  rashes Musculoskeletal: Normal range of motion, no joint effusions. No gross deformities Neurological: he is alert and oriented to person, place, and time. No cranial nerve deficit. Coordination, balance, strength, speech and gait are normal.  Skin: Skin is warm and dry. No rash noted. No erythema.  Psychiatric: Patient has a normal mood and affect. behavior is normal. Judgment and thought content normal.   No results found for this or any previous visit (from the past 2160 hour(s)).   Fall Risk:    09/29/2023    8:05 AM 09/23/2022    9:49 AM 09/22/2021   11:11 AM 08/24/2018    3:40 PM 08/01/2018    1:28 PM  Fall Risk   Falls in the past year? 0 0 0 No No  Number falls in past yr: 0 0 0    Injury with Fall? 0 0 0    Risk for fall due to : No Fall Risks      Follow up Falls prevention discussed Falls evaluation completed Falls evaluation completed       Functional Status Survey: Is the patient deaf or have difficulty hearing?: No Does the patient have difficulty seeing, even when wearing glasses/contacts?: No  Does the patient have difficulty concentrating, remembering, or making decisions?: No Does the patient have difficulty walking or climbing stairs?: No Does the patient have difficulty dressing or bathing?: No Does the patient have difficulty doing errands alone such as visiting a doctor's office or shopping?: No   Assessment & Plan  1. Annual physical exam  - CBC with Differential/Platelet - COMPLETE METABOLIC PANEL WITH GFR - Lipid panel - Hemoglobin A1c - MM 3D SCREENING MAMMOGRAM BILATERAL BREAST; Future  2. Prediabetes  - COMPLETE METABOLIC PANEL WITH GFR - Hemoglobin A1c  3. Hyperlipidemia, unspecified hyperlipidemia type  - Lipid panel  4. Encounter for screening mammogram for malignant neoplasm of breast  - MM 3D SCREENING MAMMOGRAM BILATERAL BREAST; Future  5. Screening for diabetes mellitus  - COMPLETE METABOLIC PANEL WITH GFR - Hemoglobin  A1c  6. Screening for deficiency anemia  - CBC with Differential/Platelet  7. Need for influenza vaccination  - Flu vaccine trivalent PF, 6mos and older(Flulaval,Afluria,Fluarix,Fluzone)  8. Atrophy of vulva - reports vaginal dryness, will start estradiol cream three days a week - estradiol (ESTRACE VAGINAL) 0.1 MG/GM vaginal cream; Place 1 Applicatorful vaginally 3 (three) times a week.  Dispense: 42.5 g; Refill: 12   -USPSTF grade A and B recommendations reviewed with patient; age-appropriate recommendations, preventive care, screening tests, etc discussed and encouraged; healthy living encouraged; see AVS for patient education given to patient -Discussed importance of 150 minutes of physical activity weekly, eat two servings of fish weekly, eat one serving of tree nuts ( cashews, pistachios, pecans, almonds.Marland Kitchen) every other day, eat 6 servings of fruit/vegetables daily and drink plenty of water and avoid sweet beverages.   -Reviewed Health Maintenance: Yes.

## 2023-09-26 NOTE — Progress Notes (Deleted)
Jacqueline Rush  ks

## 2023-09-29 ENCOUNTER — Encounter: Payer: Self-pay | Admitting: Nurse Practitioner

## 2023-09-29 ENCOUNTER — Other Ambulatory Visit: Payer: Self-pay

## 2023-09-29 ENCOUNTER — Ambulatory Visit (INDEPENDENT_AMBULATORY_CARE_PROVIDER_SITE_OTHER): Payer: No Typology Code available for payment source | Admitting: Nurse Practitioner

## 2023-09-29 VITALS — BP 118/76 | HR 100 | Temp 97.8°F | Resp 16 | Ht 60.0 in | Wt 134.4 lb

## 2023-09-29 DIAGNOSIS — Z23 Encounter for immunization: Secondary | ICD-10-CM | POA: Diagnosis not present

## 2023-09-29 DIAGNOSIS — R7303 Prediabetes: Secondary | ICD-10-CM

## 2023-09-29 DIAGNOSIS — Z131 Encounter for screening for diabetes mellitus: Secondary | ICD-10-CM

## 2023-09-29 DIAGNOSIS — Z130 Encounter for screening for diseases of the blood and blood-forming organs and certain disorders involving the immune mechanism: Secondary | ICD-10-CM

## 2023-09-29 DIAGNOSIS — E785 Hyperlipidemia, unspecified: Secondary | ICD-10-CM

## 2023-09-29 DIAGNOSIS — Z0001 Encounter for general adult medical examination with abnormal findings: Secondary | ICD-10-CM

## 2023-09-29 DIAGNOSIS — Z0000 Encounter for general adult medical examination without abnormal findings: Secondary | ICD-10-CM

## 2023-09-29 DIAGNOSIS — N905 Atrophy of vulva: Secondary | ICD-10-CM

## 2023-09-29 DIAGNOSIS — Z1231 Encounter for screening mammogram for malignant neoplasm of breast: Secondary | ICD-10-CM

## 2023-09-29 MED ORDER — ESTRADIOL 0.1 MG/GM VA CREA: 1.0000 | TOPICAL_CREAM | VAGINAL | 12 refills | Status: AC

## 2023-09-30 LAB — COMPLETE METABOLIC PANEL WITH GFR
AG Ratio: 1.7 (calc) (ref 1.0–2.5)
ALT: 13 U/L (ref 6–29)
AST: 22 U/L (ref 10–35)
Albumin: 4.4 g/dL (ref 3.6–5.1)
Alkaline phosphatase (APISO): 71 U/L (ref 37–153)
BUN: 13 mg/dL (ref 7–25)
CO2: 27 mmol/L (ref 20–32)
Calcium: 9.6 mg/dL (ref 8.6–10.4)
Chloride: 108 mmol/L (ref 98–110)
Creat: 0.79 mg/dL (ref 0.50–1.05)
Globulin: 2.6 g/dL (ref 1.9–3.7)
Glucose, Bld: 92 mg/dL (ref 65–99)
Potassium: 5 mmol/L (ref 3.5–5.3)
Sodium: 142 mmol/L (ref 135–146)
Total Bilirubin: 0.3 mg/dL (ref 0.2–1.2)
Total Protein: 7 g/dL (ref 6.1–8.1)
eGFR: 86 mL/min/{1.73_m2} (ref >=60)

## 2023-09-30 LAB — CBC WITH DIFFERENTIAL/PLATELET
Absolute Lymphocytes: 1117 {cells}/uL (ref 850–3900)
Absolute Monocytes: 502 {cells}/uL (ref 200–950)
Basophils Absolute: 40 {cells}/uL (ref 0–200)
Basophils Relative: 0.7 %
Eosinophils Absolute: 222 {cells}/uL (ref 15–500)
Eosinophils Relative: 3.9 %
HCT: 35.9 % (ref 35.0–45.0)
Hemoglobin: 10.8 g/dL — ABNORMAL LOW (ref 11.7–15.5)
MCH: 24.3 pg — ABNORMAL LOW (ref 27.0–33.0)
MCHC: 30.1 g/dL — ABNORMAL LOW (ref 32.0–36.0)
MCV: 80.9 fL (ref 80.0–100.0)
MPV: 11.9 fL (ref 7.5–12.5)
Monocytes Relative: 8.8 %
Neutro Abs: 3819 {cells}/uL (ref 1500–7800)
Neutrophils Relative %: 67 %
Platelets: 333 10*3/uL (ref 140–400)
RBC: 4.44 10*6/uL (ref 3.80–5.10)
RDW: 14.5 % (ref 11.0–15.0)
Total Lymphocyte: 19.6 %
WBC: 5.7 10*3/uL (ref 3.8–10.8)

## 2023-09-30 LAB — HEMOGLOBIN A1C
Hgb A1c MFr Bld: 5.7 %{Hb} — ABNORMAL HIGH (ref <5.7)
Mean Plasma Glucose: 117 mg/dL
eAG (mmol/L): 6.5 mmol/L

## 2023-09-30 LAB — LIPID PANEL
Cholesterol: 207 mg/dL — ABNORMAL HIGH (ref <200)
HDL: 111 mg/dL (ref >=50)
LDL Cholesterol (Calc): 82 mg/dL
Non-HDL Cholesterol (Calc): 96 mg/dL (ref <130)
Total CHOL/HDL Ratio: 1.9 (calc) (ref <5.0)
Triglycerides: 48 mg/dL (ref <150)

## 2023-11-01 ENCOUNTER — Ambulatory Visit
Admission: RE | Admit: 2023-11-01 | Discharge: 2023-11-01 | Disposition: A | Discharge status: Home / Self Care | Payer: No Typology Code available for payment source | Source: Ambulatory Visit | Attending: Nurse Practitioner | Admitting: Nurse Practitioner

## 2023-11-01 DIAGNOSIS — Z1231 Encounter for screening mammogram for malignant neoplasm of breast: Secondary | ICD-10-CM | POA: Diagnosis present

## 2023-11-01 DIAGNOSIS — Z0000 Encounter for general adult medical examination without abnormal findings: Secondary | ICD-10-CM

## 2024-09-30 ENCOUNTER — Encounter: Payer: Self-pay | Admitting: Nurse Practitioner

## 2024-09-30 NOTE — Progress Notes (Deleted)
 Name: Jacqueline Rush   MRN: [id number]    DOB: Feb 15, 1961   Date:09/30/2024       Progress Note  Subjective  Chief Complaint  No chief complaint on file.   HPI  Patient presents for annual CPE.  Diet:  Exercise:  Sleep:  Last dental exam: Last eye exam:   Flowsheet Row Office Visit from 09/29/2023 in [facility]  AUDIT-C Score 0   Depression: Phq 9 is  {Desc; negative/positive:13464}    09/29/2023    8:05 AM 09/23/2022    9:49 AM 09/22/2021   11:11 AM 08/24/2018    3:40 PM 08/01/2018    1:28 PM  Depression screen PHQ 2/9  Decreased Interest 0 0 0 0 0  Down, Depressed, Hopeless 0 0 0 0 0  PHQ - 2 Score 0 0 0 0 0  Altered sleeping   0  0  Tired, decreased energy   0  0  Change in appetite   0  0  Feeling bad or failure about yourself    0  0  Trouble concentrating   0  0  Moving slowly or fidgety/restless   0  0  Suicidal thoughts   0  0  PHQ-9 Score   0  0  Difficult doing work/chores   Not difficult at all  Not difficult at all   Hypertension: BP Readings from Last 3 Encounters:  09/29/23 118/76  11/24/22 125/81  09/23/22 120/76   Obesity: Wt Readings from Last 3 Encounters:  09/29/23 61 kg  11/24/22 57.8 kg  09/23/22 61.8 kg   BMI Readings from Last 3 Encounters:  09/29/23 26.25 kg/m  11/24/22 24.89 kg/m  09/23/22 26.62 kg/m     Vaccines:  HPV: up to at age 23 , ask insurance if age between 51-45  Shingrix: 49-64 yo and ask insurance if covered when patient above 80 yo Pneumonia: Educated and discussed with patient. Flu: Educated and discussed with patient.  Hep C Screening: Completed 01/24/2017 STD testing and prevention (HIV/chl/gon/syphilis): Completed 01/24/2017 Intimate partner violence: Sexual History : Menstrual History/LMP/Abnormal Bleeding:  Incontinence Symptoms:   Breast cancer:  - Last Mammogram: Last completed 11/01/2023; due 10/31/2024 - BRCA gene screening:   Osteoporosis: Discussed high  calcium and vitamin D supplementation, weight bearing exercises  Cervical cancer screening: Pap smear completed 09/22/2021; due 09/22/2024  Skin cancer: Discussed monitoring for atypical lesions  Colorectal cancer: Colonoscopy completed 10/06/2021 Lung cancer:  Low Dose CT Chest recommended if Age 72-80 years, 20 pack-year currently smoking OR have quit w/in 15years. Patient {DOES NOT does:27190::does not} qualify.   ECG: 11/25/2022  Advanced Care Planning: A voluntary discussion about advance care planning including the explanation and discussion of advance directives.  Discussed health care proxy and Living will, and the patient was able to identify a health care proxy as Roe Koffman (spouse).  Patient {DOES_DOES WNU:81435} have a living will at present time. If patient does have living will, I have requested they bring this to the clinic to be scanned in to their chart.  Lipids: Lab Results  Component Value Date   CHOL 207 (H) 09/29/2023   CHOL 240 (H) 09/23/2022   CHOL 232 (H) 09/22/2021   Lab Results  Component Value Date   HDL 111 09/29/2023   HDL 119 09/23/2022   HDL 119 09/22/2021   Lab Results  Component Value Date   LDLCALC 82 09/29/2023   LDLCALC 107 (H) 09/23/2022   LDLCALC 97 09/22/2021  Lab Results  Component Value Date   TRIG 48 09/29/2023   TRIG 58 09/23/2022   TRIG 73 09/22/2021   Lab Results  Component Value Date   CHOLHDL 1.9 09/29/2023   CHOLHDL 2.0 09/23/2022   CHOLHDL 1.9 09/22/2021   No results found for: LDLDIRECT  Glucose: Glucose  Date Value Ref Range Status  09/23/2014 85 65 - 99 mg/dL Final   Glucose, Bld  Date Value Ref Range Status  09/29/2023 92 65 - 99 mg/dL Final    Comment:    .            Fasting reference interval .   09/23/2022 94 65 - 139 mg/dL Final    Comment:    .        Non-fasting reference interval .   09/22/2021 96 65 - 99 mg/dL Final    Comment:    .            Fasting reference interval .     Glucose-Capillary  Date Value Ref Range Status  08/09/2018 103 (H) 70 - 99 mg/dL Final  91/70/7980 897 (H) 70 - 99 mg/dL Final    Patient Active Problem List   Diagnosis Date Noted   Status post total shoulder arthroplasty, left 11/24/2022   Family history of colon cancer    Polyp of transverse colon    Septic prepatellar bursitis of left knee 08/09/2018   Sepsis due to cellulitis (HCC) 08/02/2018   Seborrheic keratosis 02/27/2018   Breast cancer screening 01/24/2017   Post-traumatic osteoarthritis of left shoulder 07/01/2016   Primary osteoarthritis of right knee 05/27/2016   Complex tear of medial meniscus of right knee as current injury 05/16/2016   Rotator cuff tendinitis, left 05/16/2016   Tear of left glenoid labrum 05/16/2016   Annual physical exam 12/09/2015   Encounter for screening mammogram for malignant neoplasm of breast 12/09/2015   Skin lesion 12/09/2015   Plantar warts 12/09/2015   Need for immunization against influenza 12/09/2015   Arthritis, multiple joint involvement 12/01/2015   Varicose vein 12/01/2015   Allergic rhinitis 12/01/2015   Prediabetes 12/01/2015    Past Surgical History:  Procedure Laterality Date   BREAST CYST ASPIRATION Left    CESAREAN SECTION     COLONOSCOPY WITH PROPOFOL  N/A 10/06/2021   Procedure: COLONOSCOPY WITH PROPOFOL ;  Surgeon: Janalyn Keene NOVAK, MD;  Location: [facility];  Service: Endoscopy;  Laterality: N/A;   EXTERNAL EAR SURGERY     pt states had right ear surgery   IRRIGATION AND DEBRIDEMENT KNEE Left 08/09/2018   Procedure: IRRIGATION AND DEBRIDEMENT KNEE WITH BURSA EXCISION;  Surgeon: Edie Norleen PARAS, MD;  Location: [facility];  Service: Orthopedics;  Laterality: Left;   KNEE ARTHROSCOPY WITH MEDIAL MENISECTOMY Right 06/01/2016   Procedure: Arthroscopic partial medial meniscectomy, arthroscopic abrasion chondroplasty of femoral trochlea, and debridement of symptomatic plica, right knee.;  Surgeon: Norleen PARAS Edie, MD;   Location: [facility];  Service: Orthopedics;  Laterality: Right;   SHOULDER ARTHROSCOPY WITH DEBRIDEMENT AND BICEP TENDON REPAIR Left 06/30/2016   Procedure: SHOULDER ARTHROSCOPY WITH DEBRIDEMENT AND BICEP TENDON REPAIR;  Surgeon: Norleen PARAS Edie, MD;  Location: [facility];  Service: Orthopedics;  Laterality: Left;   SHOULDER ARTHROSCOPY WITH LABRAL REPAIR Left 06/30/2016   Procedure: SHOULDER ARTHROSCOPY WITH LABRAL REPAIR;  Surgeon: Norleen PARAS Edie, MD;  Location: [facility];  Service: Orthopedics;  Laterality: Left;   TOTAL SHOULDER ARTHROPLASTY Left 11/24/2022   Procedure: TOTAL SHOULDER ARTHROPLASTY;  Surgeon: Edie,  Norleen PARAS, MD;  Location: [facility];  Service: Orthopedics;  Laterality: Left;    Family History  Problem Relation Age of Onset   Hypertension Mother    Heart disease Mother        heart attack in 2017   Cancer Father        colon, prostate, skin   Diabetes Father    Colon cancer Father    Prostate cancer Father    Ulcerative colitis Daughter    Cancer Maternal Uncle        prostate and colon   Colon cancer Maternal Uncle    Prostate cancer Maternal Uncle    Stroke Maternal Grandmother    Heart disease Maternal Grandfather        died from a heart attack   Cancer Paternal Grandfather        colon   Colon cancer Paternal Grandfather    Breast cancer Neg Hx     Social History   Socioeconomic History   Marital status: Married    Spouse name: John   Number of children: Not on file   Years of education: Not on file   Highest education level: Not on file  Occupational History   Not on file  Tobacco Use   Smoking status: Never   Smokeless tobacco: Never  Vaping Use   Vaping status: Never Used  Substance and Sexual Activity   Alcohol use: Yes    Comment: occasional   Drug use: No   Sexual activity: Yes    Partners: Male  Other Topics Concern   Not on file  Social History Narrative   Not on file   Social Drivers of Health   Financial Resource Strain: Low  Risk  (09/29/2023)   Overall Financial Resource Strain (CARDIA)    Difficulty of Paying Living Expenses: Not hard at all  Food Insecurity: No Food Insecurity (09/29/2023)   Hunger Vital Sign    Worried About Running Out of Food in the Last Year: Never true    Ran Out of Food in the Last Year: Never true  Transportation Needs: No Transportation Needs (09/29/2023)   PRAPARE - Administrator, Civil Service (Medical): No    Lack of Transportation (Non-Medical): No  Physical Activity: Sufficiently Active (09/29/2023)   Exercise Vital Sign    Days of Exercise per Week: 4 days    Minutes of Exercise per Session: 60 min  Stress: No Stress Concern Present (09/29/2023)   [employer] of Occupational Health - Occupational Stress Questionnaire    Feeling of Stress : Only a little  Social Connections: Moderately Integrated (09/29/2023)   Social Connection and Isolation Panel    Frequency of Communication with Friends and Family: More than three times a week    Frequency of Social Gatherings with Friends and Family: More than three times a week    Attends Religious Services: 1 to 4 times per year    Active Member of [employer] or Organizations: No    Attends Banker Meetings: Never    Marital Status: Married  Catering manager Violence: Not At Risk (09/29/2023)   Humiliation, Afraid, Rape, and Kick questionnaire    Fear of Current or Ex-Partner: No    Emotionally Abused: No    Physically Abused: No    Sexually Abused: No     Current Outpatient Medications:    Cyanocobalamin (VITAMIN B 12 PO), Take 1 tablet by mouth daily at 6 (six) AM., Disp: , Rfl:  estradiol  (ESTRACE  VAGINAL) 0.1 MG/GM vaginal cream, Place 1 Applicatorful vaginally 3 (three) times a week., Disp: 42.5 g, Rfl: 12   oxyCODONE  (ROXICODONE ) 5 MG immediate release tablet, Take 1-2 tablets (5-10 mg total) by mouth every 4 (four) hours as needed for moderate pain or severe pain. (Patient not taking:  Reported on 09/29/2023), Disp: 40 tablet, Rfl: 0   Thiamine HCl (VITAMIN B1 PO), Take 1 tablet by mouth daily at 2 PM., Disp: , Rfl:    VITAMIN D PO, Take 1 tablet by mouth daily at 6 (six) AM., Disp: , Rfl:   Allergies  Allergen Reactions   Iodinated Contrast Media Swelling    Contrast dye- facial swelling   Codeine Other (See Comments)    Didn't like how it made me feel     ROS  Constitutional: Negative for fever or weight change.  Respiratory: Negative for cough and shortness of breath.   Cardiovascular: Negative for chest pain or palpitations.  Gastrointestinal: Negative for abdominal pain, no bowel changes.  Musculoskeletal: Negative for gait problem or joint swelling.  Skin: Negative for rash.  Neurological: Negative for dizziness or headache.  No other specific complaints in a complete review of systems (except as listed in HPI above).   Objective  There were no vitals filed for this visit.  There is no height or weight on file to calculate BMI.  Physical Exam ***  No results found for this or any previous visit (from the past 2160 hours).  Diabetic Foot Exam: Diabetic Foot Exam - Simple   No data filed     Fall Risk:    09/29/2023    8:05 AM 09/23/2022    9:49 AM 09/22/2021   11:11 AM 08/24/2018    3:40 PM 08/01/2018    1:28 PM  Fall Risk   Falls in the past year? 0 0 0 No  No   Number falls in past yr: 0 0 0    Injury with Fall? 0 0 0    Risk for fall due to : No Fall Risks      Follow up Falls prevention discussed Falls evaluation completed  Falls evaluation completed        Data saved with a previous flowsheet row definition    Functional Status Survey:   ***  Assessment & Plan  There are no diagnoses linked to this encounter.  -USPSTF grade A and B recommendations reviewed with patient; age-appropriate recommendations, preventive care, screening tests, etc discussed and encouraged; healthy living encouraged; see AVS for patient education  given to patient -Discussed importance of 150 minutes of physical activity weekly, eat two servings of fish weekly, eat one serving of tree nuts ( cashews, pistachios, pecans, almonds.SABRA) every other day, eat 6 servings of fruit/vegetables daily and drink plenty of water and avoid sweet beverages.   -Reviewed Health Maintenance: Yes

## 2024-10-30 ENCOUNTER — Other Ambulatory Visit: Payer: Self-pay | Admitting: Nurse Practitioner

## 2024-10-30 DIAGNOSIS — Z1231 Encounter for screening mammogram for malignant neoplasm of breast: Secondary | ICD-10-CM

## 2024-11-04 ENCOUNTER — Ambulatory Visit
Admission: RE | Admit: 2024-11-04 | Discharge: 2024-11-04 | Disposition: A | Discharge status: Home / Self Care | Source: Ambulatory Visit | Attending: Nurse Practitioner | Admitting: Nurse Practitioner

## 2024-11-04 DIAGNOSIS — Z1231 Encounter for screening mammogram for malignant neoplasm of breast: Secondary | ICD-10-CM | POA: Insufficient documentation

## 2024-12-04 ENCOUNTER — Telehealth: Payer: Self-pay | Admitting: Nurse Practitioner

## 2024-12-13 ENCOUNTER — Ambulatory Visit: Admitting: Nurse Practitioner

## 2024-12-13 ENCOUNTER — Encounter: Payer: Self-pay | Admitting: Nurse Practitioner

## 2024-12-13 VITALS — BP 130/80 | HR 78 | Temp 98.0°F | Ht 60.0 in | Wt 132.0 lb

## 2024-12-13 DIAGNOSIS — Z01818 Encounter for other preprocedural examination: Secondary | ICD-10-CM

## 2024-12-13 NOTE — Progress Notes (Signed)
 "  BP 130/80   Pulse 78   Temp 98 F (36.7 C)   Ht 5' (1.524 m)   Wt 132 lb (59.9 kg)   LMP  (LMP Unknown)   SpO2 97%   BMI 25.78 kg/m    Subjective:    Patient ID: Jacqueline Rush, female    DOB: [birth year], 64 y.o.   MRN: [id number]  HPI: Jacqueline Rush is a 64 y.o. female  Chief Complaint  Patient presents with   Medical Clearance   Discussed the use of AI scribe software for clinical note transcription with the patient, who gave verbal consent to proceed.  History of Present Illness AVEYA Rush is a 64 year old female who presents for preoperative clearance for bilateral hip replacement surgery.  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 Interim medical history since our last visit reviewed. Allergies and medications reviewed and updated.  Review of Systems Constitutional: Negative for fever or weight change.  Respiratory: Negative for  cough and shortness of breath.   Cardiovascular: Negative for chest pain or palpitations.  Gastrointestinal: Negative for abdominal pain, no bowel changes.  Musculoskeletal: positive for gait problem or joint swelling. Positive for bilateral hip pain Skin: Negative for rash.  Neurological: Negative for dizziness or headache.  No other specific complaints in a complete review of systems (except as listed in HPI above).      Objective:      BP 130/80   Pulse 78   Temp 98 F (36.7 C)   Ht 5' (1.524 m)   Wt 132 lb (59.9 kg)   LMP  (LMP Unknown)   SpO2 97%   BMI 25.78 kg/m    Wt Readings from Last 3 Encounters:  12/13/24 132 lb (59.9 kg)  09/29/23 134 lb 6.4 oz (61 kg)  11/24/22 127 lb 6.8 oz (57.8 kg)    Physical Exam GENERAL: Alert, cooperative, well developed, no acute distress HEENT: Normocephalic, normal oropharynx, moist mucous membranes CHEST: Clear to auscultation bilaterally, no wheezes, rhonchi, or crackles CARDIOVASCULAR: Normal heart rate and rhythm, S1 and S2 normal without murmurs ABDOMEN: Soft, non-tender, non-distended, without organomegaly, normal bowel sounds EXTREMITIES: No cyanosis or edema NEUROLOGICAL: Cranial nerves grossly intact, moves all extremities without gross motor or sensory deficit  Results for orders placed or performed in visit on 09/29/23  CBC with Differential/Platelet   Collection  Time: 09/29/23  8:47 AM  Result Value Ref Range   WBC 5.7 3.8 - 10.8 Thousand/uL   RBC 4.44 3.80 - 5.10 Million/uL   Hemoglobin 10.8 (L) 11.7 - 15.5 g/dL   HCT 64.0 64.9 - 54.9 %   MCV 80.9 80.0 - 100.0 fL   MCH 24.3 (L) 27.0 - 33.0 pg   MCHC 30.1 (L) 32.0 - 36.0 g/dL   RDW 85.4 88.9 - 84.9 %   Platelets 333 140 - 400 Thousand/uL   MPV 11.9 7.5 - 12.5 fL   Neutro Abs 3,819 1,500 - 7,800 cells/uL   Absolute Lymphocytes 1,117 850 - 3,900 cells/uL   Absolute Monocytes 502 200 - 950 cells/uL   Eosinophils Absolute 222 15 - 500 cells/uL   Basophils Absolute  40 0 - 200 cells/uL   Neutrophils Relative % 67 %   Total Lymphocyte 19.6 %   Monocytes Relative 8.8 %   Eosinophils Relative 3.9 %   Basophils Relative 0.7 %  COMPLETE METABOLIC PANEL WITH GFR   Collection Time: 09/29/23  8:47 AM  Result Value Ref Range   Glucose, Bld 92 65 - 99 mg/dL   BUN 13 7 - 25 mg/dL   Creat 9.20 9.49 - 8.94 mg/dL   eGFR 86 > OR = 60 fO/fpw/8.26f7   BUN/Creatinine Ratio SEE NOTE: 6 - 22 (calc)   Sodium 142 135 - 146 mmol/L   Potassium 5.0 3.5 - 5.3 mmol/L   Chloride 108 98 - 110 mmol/L   CO2 27 20 - 32 mmol/L   Calcium 9.6 8.6 - 10.4 mg/dL   Total Protein 7.0 6.1 - 8.1 g/dL   Albumin 4.4 3.6 - 5.1 g/dL   Globulin 2.6 1.9 - 3.7 g/dL (calc)   AG Ratio 1.7 1.0 - 2.5 (calc)   Total Bilirubin 0.3 0.2 - 1.2 mg/dL   Alkaline phosphatase (APISO) 71 37 - 153 U/L   AST 22 10 - 35 U/L   ALT 13 6 - 29 U/L  Lipid panel   Collection Time: 09/29/23  8:47 AM  Result Value Ref Range   Cholesterol 207 (H) <200 mg/dL   HDL 888 > OR = 50 mg/dL   Triglycerides 48 <849 mg/dL   LDL Cholesterol (Calc) 82 mg/dL (calc)   Total CHOL/HDL Ratio 1.9 <5.0 (calc)   Non-HDL Cholesterol (Calc) 96 <869 mg/dL (calc)  Hemoglobin J8r   Collection Time: 09/29/23  8:47 AM  Result Value Ref Range   Hgb A1c MFr Bld 5.7 (H) <5.7 % of total Hgb   Mean Plasma Glucose 117 mg/dL   eAG (mmol/L) 6.5 mmol/L          Assessment & Plan:   Problem List Items Addressed This Visit   None Visit Diagnoses       Preoperative clearance    -  Primary   Relevant Orders   CBC with Differential/Platelet   Comprehensive metabolic panel with GFR   Hemoglobin A1c   EKG 12-Lead        Assessment and Plan Assessment & Plan Preoperative evaluation for right total hip arthroplasty Scheduled for right total hip arthroplasty on February 19th due to severe bilateral hip osteoarthritis with bone-on-bone contact and a cyst on the right hip. Decision to proceed with unilateral surgery due to risks  associated with bilateral surgery, including infection and recovery challenges. Anterior approach chosen for better recovery outcomes. Surgeon Dr. Lorelle, experienced in anterior procedures, will perform the surgery. - Ordered EKG and lab work including  CBC and CMP. A1C - Will optimize for surgery if EKG and lab work are normal EKG: normal EKG, normal sinus rhythm, unchanged from previous tracings.   Bilateral primary osteoarthritis of hip Chronic bilateral hip pain with bone-on-bone contact on imaging. Right hip more symptomatic with a cyst present. Symptoms include difficulty with external rotation and daily activities, impacting quality of life. Family history of joint replacements. Decision to proceed with right hip arthroplasty first due to dominance and severity of symptoms.  Prediabetes Last A1c indicating prediabetic status. - Ordered A1c test        Follow up plan: Return if symptoms worsen or fail to improve. "

## 2024-12-14 LAB — COMPREHENSIVE METABOLIC PANEL WITH GFR
AG Ratio: 1.6 (calc) (ref 1.0–2.5)
ALT: 16 U/L (ref 6–29)
AST: 22 U/L (ref 10–35)
Albumin: 4 g/dL (ref 3.6–5.1)
Alkaline phosphatase (APISO): 73 U/L (ref 37–153)
BUN: 13 mg/dL (ref 7–25)
CO2: 31 mmol/L (ref 20–32)
Calcium: 9.3 mg/dL (ref 8.6–10.4)
Chloride: 105 mmol/L (ref 98–110)
Creat: 0.64 mg/dL (ref 0.50–1.05)
Globulin: 2.5 g/dL (ref 1.9–3.7)
Glucose, Bld: 83 mg/dL (ref 65–139)
Potassium: 3.9 mmol/L (ref 3.5–5.3)
Sodium: 141 mmol/L (ref 135–146)
Total Bilirubin: 0.4 mg/dL (ref 0.2–1.2)
Total Protein: 6.5 g/dL (ref 6.1–8.1)
eGFR: 100 mL/min/1.73m2

## 2024-12-14 LAB — HEMOGLOBIN A1C
Hgb A1c MFr Bld: 5.5 %
Mean Plasma Glucose: 111 mg/dL
eAG (mmol/L): 6.2 mmol/L

## 2024-12-14 LAB — CBC WITH DIFFERENTIAL/PLATELET
Absolute Lymphocytes: 1642 {cells}/uL (ref 850–3900)
Absolute Monocytes: 410 {cells}/uL (ref 200–950)
Basophils Absolute: 29 {cells}/uL (ref 0–200)
Basophils Relative: 0.5 %
Eosinophils Absolute: 239 {cells}/uL (ref 15–500)
Eosinophils Relative: 4.2 %
HCT: 35.9 % (ref 35.9–46.0)
Hemoglobin: 11.6 g/dL — ABNORMAL LOW (ref 11.7–15.5)
MCH: 27.6 pg (ref 27.0–33.0)
MCHC: 32.3 g/dL (ref 31.6–35.4)
MCV: 85.5 fL (ref 81.4–101.7)
MPV: 11.1 fL (ref 7.5–12.5)
Monocytes Relative: 7.2 %
Neutro Abs: 3380 {cells}/uL (ref 1500–7800)
Neutrophils Relative %: 59.3 %
Platelets: 294 Thousand/uL (ref 140–400)
RBC: 4.2 Million/uL (ref 3.80–5.10)
RDW: 12.6 % (ref 11.0–15.0)
Total Lymphocyte: 28.8 %
WBC: 5.7 Thousand/uL (ref 3.8–10.8)

## 2024-12-16 ENCOUNTER — Ambulatory Visit: Payer: Self-pay | Admitting: Nurse Practitioner

## 2025-01-10 ENCOUNTER — Other Ambulatory Visit: Payer: Self-pay | Admitting: Orthopedic Surgery

## 2025-01-13 ENCOUNTER — Other Ambulatory Visit: Payer: Self-pay | Admitting: Orthopedic Surgery

## 2025-01-21 ENCOUNTER — Other Ambulatory Visit

## 2025-01-21 ENCOUNTER — Inpatient Hospital Stay: Admission: RE | Admit: 2025-01-21

## 2025-01-30 ENCOUNTER — Ambulatory Visit: Admit: 2025-01-30 | Admitting: Orthopedic Surgery
